# Patient Record
Sex: Female | Born: 1985
Health system: Southern US, Community
[De-identification: ages and names within clinical notes are randomized; demographics above are authoritative.]

## PROBLEM LIST (undated history)

## (undated) DIAGNOSIS — O1490 Unspecified pre-eclampsia, unspecified trimester: Secondary | ICD-10-CM

## (undated) DIAGNOSIS — F419 Anxiety disorder, unspecified: Secondary | ICD-10-CM

## (undated) HISTORY — PX: TONSILLECTOMY: SUR1361

## (undated) HISTORY — DX: Anxiety disorder, unspecified: F41.9

---

## 2014-08-13 ENCOUNTER — Encounter (HOSPITAL_BASED_OUTPATIENT_CLINIC_OR_DEPARTMENT_OTHER): Payer: Self-pay | Admitting: Emergency Medicine

## 2014-08-13 ENCOUNTER — Emergency Department (HOSPITAL_BASED_OUTPATIENT_CLINIC_OR_DEPARTMENT_OTHER)
Admission: EM | Admit: 2014-08-13 | Discharge: 2014-08-13 | Disposition: A | Discharge status: Home / Self Care | Attending: Emergency Medicine | Admitting: Emergency Medicine

## 2014-08-13 DIAGNOSIS — Z791 Long term (current) use of non-steroidal anti-inflammatories (NSAID): Secondary | ICD-10-CM | POA: Insufficient documentation

## 2014-08-13 DIAGNOSIS — M940 Chondrocostal junction syndrome [Tietze]: Secondary | ICD-10-CM | POA: Insufficient documentation

## 2014-08-13 DIAGNOSIS — R079 Chest pain, unspecified: Secondary | ICD-10-CM | POA: Insufficient documentation

## 2014-08-13 HISTORY — DX: Unspecified pre-eclampsia, unspecified trimester: O14.90

## 2014-08-13 MED ORDER — NAPROXEN 500 MG PO TABS: 500.0000 mg | ORAL_TABLET | Freq: Two times a day (BID) | ORAL | Status: DC

## 2014-08-13 NOTE — ED Provider Notes (Addendum)
CSN: [id number]     Arrival date & time 08/13/14  2055 History   First MD Initiated Contact with Patient 08/13/14 2150     Chief Complaint  Patient presents with  . Chest Pain      HPI  Bilateral anterior parasternal chest pain. States it is a sharp. It hurts when she leans over or rotates to the side. She leaned over at work today to pick something up and it started. He been present since that time. No falls injuries or trauma. No cough or shortness of breath. No fevers.  Past Medical History  Diagnosis Date  . Preeclampsia    Past Surgical History  Procedure Laterality Date  . Cesarean section    . Tonsillectomy     No family history on file. History  Substance Use Topics  . Smoking status: Never Smoker   . Smokeless tobacco: Not on file  . Alcohol Use: Yes   OB History   Grav Para Term Preterm Abortions TAB SAB Ect Mult Living                 Review of Systems  Constitutional: Negative for fever, chills, diaphoresis, appetite change and fatigue.  HENT: Negative for mouth sores, sore throat and trouble swallowing.   Eyes: Negative for visual disturbance.  Respiratory: Negative for cough, chest tightness, shortness of breath and wheezing.   Cardiovascular: Positive for chest pain.  Gastrointestinal: Negative for nausea, vomiting, abdominal pain, diarrhea and abdominal distention.  Endocrine: Negative for polydipsia, polyphagia and polyuria.  Genitourinary: Negative for dysuria, frequency and hematuria.  Musculoskeletal: Negative for gait problem.  Skin: Negative for color change, pallor and rash.  Neurological: Negative for dizziness, syncope, light-headedness and headaches.  Hematological: Does not bruise/bleed easily.  Psychiatric/Behavioral: Negative for behavioral problems and confusion.      Allergies  Review of patient's allergies indicates no known allergies.  Home Medications   Prior to Admission medications   Medication Sig Start Date End Date  Taking? Authorizing Provider  naproxen (NAPROSYN) 500 MG tablet Take 1 tablet (500 mg total) by mouth 2 (two) times daily. 08/13/14   Rolland Porter, MD   BP 128/88  Pulse 78  Temp(Src) 98.3 F (36.8 C) (Oral)  Resp 18  Ht  (1.702 m)  Wt 205 lb (92.987 kg)  BMI 32.10 kg/m2  SpO2 100%  LMP 08/12/2014 Physical Exam  Constitutional: She is oriented to person, place, and time. She appears well-developed and well-nourished. No distress.  HENT:  Head: Normocephalic.  Eyes: Conjunctivae are normal. Pupils are equal, round, and reactive to light. No scleral icterus.  Neck: Normal range of motion. Neck supple. No thyromegaly present.  Cardiovascular: Normal rate and regular rhythm.  Exam reveals no gallop and no friction rub.   No murmur heard. Pulmonary/Chest: Effort normal and breath sounds normal. No respiratory distress. She has no wheezes. She has no rales.    Abdominal: Soft. Bowel sounds are normal. She exhibits no distension. There is no tenderness. There is no rebound.  Musculoskeletal: Normal range of motion.  Neurological: She is alert and oriented to person, place, and time.  Skin: Skin is warm and dry. No rash noted.  Psychiatric: She has a normal mood and affect. Her behavior is normal.    ED Course  Procedures (including critical care time) Labs Review Labs Reviewed - No data to display  Imaging Review No results found.   EKG Interpretation None      MDM   Final  diagnoses:  Costochondritis    Reproducable tenderness and pain. Normal EKG.      Rolland Porter, MD 08/13/14 1610  Rolland Porter, MD 08/28/14 [phone]

## 2014-08-13 NOTE — ED Notes (Signed)
Provider at bedside for pt eval.

## 2014-08-13 NOTE — Discharge Instructions (Signed)
Costochondritis Costochondritis, sometimes called Tietze syndrome, is a swelling and irritation (inflammation) of the tissue (cartilage) that connects your ribs with your breastbone (sternum). It causes pain in the chest and rib area. Costochondritis usually goes away on its own over time. It can take up to 6 weeks or longer to get better, especially if you are unable to limit your activities. CAUSES  Some cases of costochondritis have no known cause. Possible causes include:  Injury (trauma).  Exercise or activity such as lifting.  Severe coughing. SIGNS AND SYMPTOMS  Pain and tenderness in the chest and rib area.  Pain that gets worse when coughing or taking deep breaths.  Pain that gets worse with specific movements. DIAGNOSIS  Your health care provider will do a physical exam and ask about your symptoms. Chest X-rays or other tests may be done to rule out other problems. TREATMENT  Costochondritis usually goes away on its own over time. Your health care provider may prescribe medicine to help relieve pain. HOME CARE INSTRUCTIONS   Avoid exhausting physical activity. Try not to strain your ribs during normal activity. This would include any activities using chest, abdominal, and side muscles, especially if heavy weights are used.  Apply ice to the affected area for the first 2 days after the pain begins.  Put ice in a plastic bag.  Place a towel between your skin and the bag.  Leave the ice on for 20 minutes, 2-3 times a day.  Only take over-the-counter or prescription medicines as directed by your health care provider. SEEK MEDICAL CARE IF:  You have redness or swelling at the rib joints. These are signs of infection.  Your pain does not go away despite rest or medicine. SEEK IMMEDIATE MEDICAL CARE IF:   Your pain increases or you are very uncomfortable.  You have shortness of breath or difficulty breathing.  You cough up blood.  You have worse chest pains,  sweating, or vomiting.  You have a fever or persistent symptoms for more than 2-3 days.  You have a fever and your symptoms suddenly get worse. MAKE SURE YOU:   Understand these instructions.  Will watch your condition.  Will get help right away if you are not doing well or get worse. Document Released: 09/17/2005 Document Revised: 09/28/2013 Document Reviewed: 07/12/2013 [facility] Patient Information 2015 [place], [place]. This information is not intended to replace advice given to you by your health care provider. Make sure you discuss any questions you have with your health care provider.  Costochondritis Costochondritis, sometimes called Tietze syndrome, is a swelling and irritation (inflammation) of the tissue (cartilage) that connects your ribs with your breastbone (sternum). It causes pain in the chest and rib area. Costochondritis usually goes away on its own over time. It can take up to 6 weeks or longer to get better, especially if you are unable to limit your activities. CAUSES  Some cases of costochondritis have no known cause. Possible causes include:  Injury (trauma).  Exercise or activity such as lifting.  Severe coughing. SIGNS AND SYMPTOMS  Pain and tenderness in the chest and rib area.  Pain that gets worse when coughing or taking deep breaths.  Pain that gets worse with specific movements. DIAGNOSIS  Your health care provider will do a physical exam and ask about your symptoms. Chest X-rays or other tests may be done to rule out other problems. TREATMENT  Costochondritis usually goes away on its own over time. Your health care provider may prescribe medicine  to help relieve pain. HOME CARE INSTRUCTIONS   Avoid exhausting physical activity. Try not to strain your ribs during normal activity. This would include any activities using chest, abdominal, and side muscles, especially if heavy weights are used.  Apply ice to the affected area for the first 2 days after  the pain begins.  Put ice in a plastic bag.  Place a towel between your skin and the bag.  Leave the ice on for 20 minutes, 2-3 times a day.  Only take over-the-counter or prescription medicines as directed by your health care provider. SEEK MEDICAL CARE IF:  You have redness or swelling at the rib joints. These are signs of infection.  Your pain does not go away despite rest or medicine. SEEK IMMEDIATE MEDICAL CARE IF:   Your pain increases or you are very uncomfortable.  You have shortness of breath or difficulty breathing.  You cough up blood.  You have worse chest pains, sweating, or vomiting.  You have a fever or persistent symptoms for more than 2-3 days.  You have a fever and your symptoms suddenly get worse. MAKE SURE YOU:   Understand these instructions.  Will watch your condition.  Will get help right away if you are not doing well or get worse. Document Released: 09/17/2005 Document Revised: 09/28/2013 Document Reviewed: 07/12/2013 [facility] Patient Information 2015 [place], [place]. This information is not intended to replace advice given to you by your health care provider. Make sure you discuss any questions you have with your health care provider.

## 2014-08-13 NOTE — ED Notes (Signed)
Pt reports centralized chest pressure described as heaviness - pt states she has attempted to drink tea and use warm compresses to alleviate the pain w/o success. Pt admits to family hx of cardiac disease - denies any recent cough/illness, fever, dizziness, shortness of breath or n/v.

## 2014-08-17 ENCOUNTER — Emergency Department (HOSPITAL_BASED_OUTPATIENT_CLINIC_OR_DEPARTMENT_OTHER)

## 2014-08-17 ENCOUNTER — Emergency Department (HOSPITAL_BASED_OUTPATIENT_CLINIC_OR_DEPARTMENT_OTHER)
Admission: EM | Admit: 2014-08-17 | Discharge: 2014-08-17 | Disposition: A | Discharge status: Home / Self Care | Attending: Emergency Medicine | Admitting: Emergency Medicine

## 2014-08-17 ENCOUNTER — Encounter (HOSPITAL_BASED_OUTPATIENT_CLINIC_OR_DEPARTMENT_OTHER): Payer: Self-pay | Admitting: Emergency Medicine

## 2014-08-17 DIAGNOSIS — S199XXA Unspecified injury of neck, initial encounter: Secondary | ICD-10-CM | POA: Diagnosis not present

## 2014-08-17 DIAGNOSIS — S0010XA Contusion of unspecified eyelid and periocular area, initial encounter: Secondary | ICD-10-CM | POA: Diagnosis not present

## 2014-08-17 DIAGNOSIS — S6990XA Unspecified injury of unspecified wrist, hand and finger(s), initial encounter: Secondary | ICD-10-CM

## 2014-08-17 DIAGNOSIS — S0993XA Unspecified injury of face, initial encounter: Secondary | ICD-10-CM | POA: Diagnosis not present

## 2014-08-17 DIAGNOSIS — S60219A Contusion of unspecified wrist, initial encounter: Secondary | ICD-10-CM | POA: Diagnosis not present

## 2014-08-17 DIAGNOSIS — S59909A Unspecified injury of unspecified elbow, initial encounter: Secondary | ICD-10-CM | POA: Diagnosis present

## 2014-08-17 DIAGNOSIS — T148XXA Other injury of unspecified body region, initial encounter: Secondary | ICD-10-CM

## 2014-08-17 DIAGNOSIS — IMO0002 Reserved for concepts with insufficient information to code with codable children: Secondary | ICD-10-CM | POA: Diagnosis not present

## 2014-08-17 DIAGNOSIS — Z791 Long term (current) use of non-steroidal anti-inflammatories (NSAID): Secondary | ICD-10-CM | POA: Insufficient documentation

## 2014-08-17 DIAGNOSIS — S59919A Unspecified injury of unspecified forearm, initial encounter: Secondary | ICD-10-CM

## 2014-08-17 DIAGNOSIS — M25531 Pain in right wrist: Secondary | ICD-10-CM
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
## 2014-08-17 NOTE — Discharge Instructions (Signed)
°Emergency Department Resource Guide °1) Find a Doctor and Pay Out of Pocket °Although you won't have to find out who is covered by your insurance plan, it is a good idea to ask around and get recommendations. You will then need to call the office and see if the doctor you have chosen will accept you as a new patient and what types of options they offer for patients who are self-pay. Some doctors offer discounts or will set up payment plans for their patients who do not have insurance, but you will need to ask so you aren't surprised when you get to your appointment. ° °2) Contact Your Local Health Department °Not all health departments have doctors that can see patients for sick visits, but many do, so it is worth a call to see if yours does. If you don't know where your local health department is, you can check in your phone book. The CDC also has a tool to help you locate your state's health department, and many state websites also have listings of all of their local health departments. ° °3) Find a Walk-in Clinic °If your illness is not likely to be very severe or complicated, you may want to try a walk in clinic. These are popping up all over the country in pharmacies, drugstores, and shopping centers. They're usually staffed by nurse practitioners or physician assistants that have been trained to treat common illnesses and complaints. They're usually fairly quick and inexpensive. However, if you have serious medical issues or chronic medical problems, these are probably not your best option. ° °No Primary Care Doctor: °- Call [facility] at  832-8000 - they can help you locate a primary care doctor that  accepts your insurance, provides certain services, etc. °- Physician Referral Service- 1-800-533-3463 ° °Chronic Pain Problems: °Organization         Address  Phone   Notes  °[facility]  (336) 297-2271 Patients need to be referred by their primary care doctor.  ° °Medication  Assistance: °Organization         Address  Phone   Notes  °[place] [facility] [address] °[place], [place] [address] (336) 641-8030 --Must be a resident of [place] °-- Must have NO insurance coverage whatsoever (no Medicaid/ Medicare, etc.) °-- The pt. MUST have a primary care doctor that directs their care regularly and follows them in the community °  °[facility]  (866) 331-1348   °[employer]  (888) 892-1162   ° °Agencies that provide inexpensive medical care: °Organization         Address  Phone   Notes  °[facility]  (336) 832-8035   °[facility]    (336) 832-7272   °[facility] [address] °[place], [place] [address] (336) 832-4777   °[facility] [address], °[place] (336) 271-4999   °[facility]    (336) 373-0678   °[facility]    (336) 272-1050   °[facility] ° [address],  Phone:  (336) 832-4444, Fax:  (336) 832-4440 Hours of Operation:  9 am - 6 pm, M-F.  Also accepts Medicaid/Medicare and self-pay.  °[facility] ° [address],  Phone: (336) 832-3150, Fax: (336) 832-3151. Hours of Operation:  8:30 am - 5:30 pm, M-F.  Also accepts Medicaid and self-pay.  °[facility] [place] [address], [place] Phone: (336) 878-6027   °[facility] [address], [place], [place] (336)723-1848, Ext. 123 Mondays & Thursdays: 7-9 AM.  First 15 patients are seen on a first come, first serve basis. °  ° °Medicaid-accepting [place] Providers: ° °Organization         Address  Phone   Notes  °[facility] [address], [place] (336) 641-2100 Also accepts self-pay patients.  °[facility] [address], [place] ° (336) 856-9996   °[facility] [address], [place]  (336) 288-8857   °[facility] [address] [place] [address], [place] (336) 299-7000   °Veita Bland [address], [place]  ° (336) 373-1557 Only accepts [place] Access Medicaid patients after they have their name applied to their card.  ° °Self-Pay (no insurance) in [place]: ° °Organization         Address  Phone   Notes  °Sickle Cell Patients, [facility] [address], [place] (336) 832-1970   °[facility] [address], [place] (336) 832-4400   ° [facility] ° [address], [place] (336) 992-4800   °[facility]/Dr. Osei-Bonsu ° [address] [place] [address], [place] or [address], [place] (336) 841-8500 Phone number for both [place] and [place] locations is the same.  °[facility] [address], [place] (336) 299-0000   °[facility] [address] [place] [address], [place] or [address] (336) 852-7530 °(336) 878-2260   °[facility] [address], [place] (336) 350-1642, phone; (336) 294-5005, fax Sees patients 1st and 3rd Saturday of every month.  Must not qualify for public or private insurance (i.e. Medicaid, Medicare, [place] Health Choice, Veterans' Benefits) • Household income should be no more than 200% of the poverty level •The clinic cannot treat you if you are pregnant or think you are pregnant • Sexually transmitted diseases are not treated at the clinic.  ° ° °Dental Care: °Organization         Address  Phone  Notes  °[place] [facility] [address], [place] (336) 641-6152 Accepts children up to age 21 who are enrolled in Medicaid or [place] Health Choice; pregnant women with a Medicaid card; and children who have applied for Medicaid or [place] Health Choice, but were declined, whose parents can pay a reduced fee at time of service.  °[place]  [facility] [place]  [address], [place] (336) 641-7733 Accepts children up to age 21 who are enrolled in Medicaid or [place] Health Choice; pregnant women with a Medicaid card; and children who have applied for Medicaid or [place] Health Choice, but were declined, whose parents can pay a reduced fee at time of service.  °[facility] ° [address],  (336) 641-4533 Patients are seen by appointment only. Walk-ins are not accepted. [facility] will see patients 18 years of age and older. °Monday - Tuesday (8am-5pm) °Most Wednesdays (8:30-5pm) °$30 per visit, cash only  °[facility] ° [address], [place] (336) 641-4533 Patients are seen by appointment only. Walk-ins are not accepted. [facility] will see patients 18 years of age and older. °One   Wednesday Evening (Monthly: Volunteer Based).  $30 per visit, cash only  °[facility]  (919) 537-3737 for adults; Children under age 4, call Graduate Pediatric Dentistry at (919) [phone]. Children aged 4-14, please call (919) 537-3737 to request a pediatric application. ° Dental services are provided in all areas of dental care including fillings, crowns and bridges, complete and partial dentures, implants, gum treatment, root canals, and extractions. Preventive care is also provided. Treatment is provided to both adults and children. °Patients are selected via a lottery and there is often a waiting list. °  °[facility] [address], °[place] ° (336) 763-8833 [email or web address] °  °[facility] [address], [place], [place] (336)723-1848, Ext. 123 Second and Fourth Thursday of each month, opens at 6:30 AM; Clinic ends at 9 AM.  Patients are seen on a first-come first-served basis, and a limited number are seen during each clinic.  ° °[facility] ° [address], [place], [place] (336) 723-7904    Eligibility Requirements °You must have lived in [place], [place], or [place] counties for at least the last three months. °  You cannot be eligible for state or federal sponsored healthcare insurance, including Veterans Administration, Medicaid, or Medicare. °  You generally cannot be eligible for healthcare insurance through your employer.  °  How to apply: °Eligibility screenings are held every Tuesday and Wednesday afternoon from 1:00 pm until 4:00 pm. You do not need an appointment for the interview!  °[facility] [address], [place], [place] 336-631-2330   °[facility]  336-342-8273   °[facility]  336-703-3100   °[facility]  336-570-6415   ° °Behavioral Health Resources in the Community: °Intensive Outpatient Programs °Organization         Address  Phone  Notes  °[facility] [address], [place], [place] 336-878-6098   °[facility] [address], [place], [place] 336-832-9800   °[facility] [address], [place], [place] ° 336-882-2125   °[facility] [address],  °[place], [place] 1-800-853-5163 or 336-641-4981   °Substance Abuse Resources °Organization         Address  Phone  Notes  °[facility]  336-882-2125   °[facility]  336-784-9470   °[facility]  336-285-9073   °[facility]  336-845-3988   °Residential & Outpatient Substance Abuse Program  1-800-659-3381   °Psychological Services °Organization         Address  Phone  Notes  °[facility]  336- 832-9600   °[facility]  336- 378-7881   °[facility] [address], [place] 1-800-853-5163 or 336-641-4981   ° °Mobile Crisis Teams °Organization         Address  Phone  Notes  °[facility], Mobile Crisis Care Unit  1-877-626-1772   °Assertive °[facility] ° [address]  [place], [place] 336-834-9664   °Sharon DeEsch [address] °[place] 336-554-5454   ° °Self-Help/Support Groups °Organization         Address  Phone             Notes  °[facility]  - variety of support groups  336- 373-1402 Call for more information  °Narcotics Anonymous (NA), [facility] [address], °[place]   2 meetings at this location  ° °  Residential Treatment Programs °Organization         Address  Phone  Notes  °[facility] [address],    °[place]  1-866-801-8205   °[facility] ° [address], [place], [place] 704-293-8524   °[facility] [address], [place] 336-845-3988 Admissions: 8am-3pm M-F  °[facility] [address]    °[place], [place] 336-841-1104   °[facility] [address], [place], [place] 336-379-7146   °[facility] [address]  °[place], [place] 336-285-9073   °[facility] Intensive Outpatient [address], [place], [place] 336-852-3033   °[facility] [address]  °[place], [place] 1-877-615-2722 or 336-784-9470   °[facility] [address] [place], [place] 336-227-7417 Accepts Medicaid  °[facility] [address]  °[place] 1-800-659-3381 Substance Abuse/Addiction Treatment  ° °[place] Behavioral Health Resources °Organization         Address  Phone  Notes  °[facility]  (888) 581-9988   °Julie Brannon, PhD [address] [place], [place]   (336) 349-5553 or (336) 951-0000   °[facility]   [address] °[place], [place] (336) 349-4454   °[facility] [address], [place], [place] (336) 342-8316 Insurance/Medicaid/sponsorship through [facility]  °[facility] [address]                                    [place], [place] (336) 342-8316 Therapy/tele-psych/case    °[facility] [address]  ° [place], [place] (336) 349-2233    °Dr. Arfeen  (336) 349-4544   °[facility] [place]  [employer] [place] [facility] 1) [address], [place] °2) [address], [place] °3)  [address], [place] (336) 349-3220 °(336) 342-7768 ° °(336) 342-8140   °[place] [facility] (336) 342-1394 or (336) 342-3537 (After Hours)    ° ° °

## 2014-08-22 ENCOUNTER — Ambulatory Visit (INDEPENDENT_AMBULATORY_CARE_PROVIDER_SITE_OTHER): Admitting: Emergency Medicine

## 2014-08-22 VITALS — BP 106/60 | HR 75 | Temp 98.0°F | Resp 16 | Ht 66.0 in | Wt 205.1 lb

## 2014-08-22 DIAGNOSIS — R350 Frequency of micturition: Secondary | ICD-10-CM

## 2014-08-22 DIAGNOSIS — N3000 Acute cystitis without hematuria: Secondary | ICD-10-CM

## 2014-08-22 LAB — POCT UA - MICROSCOPIC ONLY
CRYSTALS, UR, HPF, POC: NEGATIVE
Casts, Ur, LPF, POC: NEGATIVE
Mucus, UA: NEGATIVE
YEAST UA: NEGATIVE

## 2014-08-22 LAB — POCT URINALYSIS DIPSTICK
Bilirubin, UA: NEGATIVE
Glucose, UA: NEGATIVE
KETONES UA: NEGATIVE
NITRITE UA: POSITIVE
Protein, UA: 30
Spec Grav, UA: 1.01
Urobilinogen, UA: 0.2
pH, UA: 5.5

## 2014-08-22 MED ORDER — PHENAZOPYRIDINE HCL 200 MG PO TABS: 200.0000 mg | ORAL_TABLET | Freq: Three times a day (TID) | ORAL | Status: DC | PRN

## 2014-08-22 MED ORDER — SULFAMETHOXAZOLE-TMP DS 800-160 MG PO TABS: 1.0000 | ORAL_TABLET | Freq: Two times a day (BID) | ORAL | Status: DC

## 2014-08-22 NOTE — Progress Notes (Signed)
[facility] [address] [place] [address] [phone]  Date:  08/22/2014   Name:  Deborah Rodriguez   DOB:  1986-07-17   MRN:  [id number]  PCP:  No PCP Per Patient    Chief Complaint: Urinary Tract Infection   History of Present Illness:  Deborah Rodriguez is a 28 y.o. very pleasant female patient who presents with the following:  Patient gives a history of dysuria and urgency today.  No nausea or vomiting.  No back pain No stool change.  No vaginal discharge or bleeding.   No antecedent illness No improvement with over the counter medications or other home remedies.  Denies other complaint or health concern today.   There are no active problems to display for this patient.   Past Medical History  Diagnosis Date  . Preeclampsia   . Anxiety     Past Surgical History  Procedure Laterality Date  . Cesarean section    . Tonsillectomy      History  Substance Use Topics  . Smoking status: Never Smoker   . Smokeless tobacco: Never Used  . Alcohol Use: 0.5 oz/week    1 drink(s) per week    Family History  Problem Relation Age of Onset  . Diabetes Maternal Grandfather   . Stroke Paternal Grandfather     No Known Allergies  Medication list has been reviewed and updated.  No current outpatient prescriptions on file prior to visit.   No current facility-administered medications on file prior to visit.    Review of Systems:  As per HPI, otherwise negative.    Physical Examination: Filed Vitals:   08/22/14 2038  BP: 106/60  Pulse: 75  Temp: 98 F (36.7 C)  Resp: 16   Filed Vitals:   08/22/14 2038  Height:  (1.676 m)  Weight: 205 lb 2 oz (93.044 kg)   Body mass index is 33.12 kg/(m^2). Ideal Body Weight: Weight in (lb) to have BMI = 25: 154.6  GEN: WDWN, NAD, Non-toxic, A & O x 3 HEENT: Atraumatic, Normocephalic. Neck supple. No masses, No LAD. Ears and Nose: No external deformity. CV: RRR, No M/G/R. No JVD. No thrill. No  extra heart sounds. PULM: CTA B, no wheezes, crackles, rhonchi. No retractions. No resp. distress. No accessory muscle use. ABD: S, NT, ND, +BS. No rebound. No HSM. EXTR: No c/c/e NEURO Normal gait.  PSYCH: Normally interactive. Conversant. Not depressed or anxious appearing.  Calm demeanor.    Assessment and Plan: Cystitis Septra Pyridium  Signed,  Phillips Odor, MD  Results for orders placed in visit on 08/22/14  POCT UA - MICROSCOPIC ONLY      Result Value Ref Range   WBC, Ur, HPF, POC TNTC     RBC, urine, microscopic TNTC     Bacteria, U Microscopic 1+     Mucus, UA neg     Epithelial cells, urine per micros 3-5     Crystals, Ur, HPF, POC neg     Casts, Ur, LPF, POC neg     Yeast, UA neg    POCT URINALYSIS DIPSTICK      Result Value Ref Range   Color, UA yellow     Clarity, UA cloudy     Glucose, UA neg     Bilirubin, UA neg     Ketones, UA neg     Spec Grav, UA 1.010     Blood, UA large     pH, UA 5.5  Protein, UA 30     Urobilinogen, UA 0.2     Nitrite, UA positive     Leukocytes, UA moderate (2+)

## 2014-08-22 NOTE — Patient Instructions (Signed)

## 2014-08-26 ENCOUNTER — Encounter (HOSPITAL_BASED_OUTPATIENT_CLINIC_OR_DEPARTMENT_OTHER): Payer: Self-pay | Admitting: Emergency Medicine

## 2014-08-26 ENCOUNTER — Emergency Department (HOSPITAL_BASED_OUTPATIENT_CLINIC_OR_DEPARTMENT_OTHER)
Admission: EM | Admit: 2014-08-26 | Discharge: 2014-08-27 | Disposition: A | Discharge status: Home / Self Care | Attending: Emergency Medicine | Admitting: Emergency Medicine

## 2014-08-26 DIAGNOSIS — Z3202 Encounter for pregnancy test, result negative: Secondary | ICD-10-CM | POA: Insufficient documentation

## 2014-08-26 DIAGNOSIS — N390 Urinary tract infection, site not specified: Secondary | ICD-10-CM

## 2014-08-26 DIAGNOSIS — Z79899 Other long term (current) drug therapy: Secondary | ICD-10-CM | POA: Diagnosis not present

## 2014-08-26 DIAGNOSIS — F411 Generalized anxiety disorder: Secondary | ICD-10-CM | POA: Insufficient documentation

## 2014-08-26 LAB — URINE MICROSCOPIC-ADD ON

## 2014-08-26 LAB — URINALYSIS, ROUTINE W REFLEX MICROSCOPIC
Glucose, UA: NEGATIVE mg/dL
HGB URINE DIPSTICK: NEGATIVE
KETONES UR: NEGATIVE mg/dL
Nitrite: POSITIVE — AB
PH: 5.5 (ref 5.0–8.0)
Protein, ur: 30 mg/dL — AB
SPECIFIC GRAVITY, URINE: 1.018 (ref 1.005–1.030)
Urobilinogen, UA: 1 mg/dL (ref 0.0–1.0)

## 2014-08-26 LAB — PREGNANCY, URINE: Preg Test, Ur: NEGATIVE

## 2014-08-26 NOTE — ED Provider Notes (Signed)
CSN: [id number]     Arrival date & time 08/26/14  2303 History   First MD Initiated Contact with Patient 08/26/14 2356    This chart was scribed for Deborah Seamen, MD by Tonye Royalty, ED Scribe. This patient was seen in room MH02/MH02 and the patient's care was started at 11:59 PM.     Chief Complaint  Patient presents with  . Urinary Tract Infection   The history is provided by the patient and medical records. No language interpreter was used.   HPI Comments: Deborah Rodriguez is a 28 y.o. female who presents to the Emergency Department complaining of a UTI. She was seen at an urgent care for dysuria one week ago and was diagnosed with UTI and was prescribed Bactrim. She states she is still experiencing dysuria (burning with urination), which she rates at 8/10, and urinary frequency. She reports associated nausea but only once after taking Bactrim on an empty stomach. She denies fever, chills, vomiting, diarrhea, vaginal bleeding, vaginal discharge or hematuria.   Past Medical History  Diagnosis Date  . Preeclampsia   . Anxiety    Past Surgical History  Procedure Laterality Date  . Cesarean section    . Tonsillectomy     Family History  Problem Relation Age of Onset  . Diabetes Maternal Grandfather   . Stroke Paternal Grandfather    History  Substance Use Topics  . Smoking status: Never Smoker   . Smokeless tobacco: Never Used  . Alcohol Use: 0.5 oz/week    1 drink(s) per week   OB History   Grav Para Term Preterm Abortions TAB SAB Ect Mult Living                 Review of Systems A complete 10 system review of systems was obtained and all systems are negative except as noted in the HPI and PMH.   Allergies  Review of patient's allergies indicates no known allergies.  Home Medications   Prior to Admission medications   Medication Sig Start Date End Date Taking? Authorizing Provider  phenazopyridine (PYRIDIUM) 200 MG tablet Take 1 tablet (200 mg total) by mouth 3  (three) times daily as needed. 08/22/14  Yes Carmelina Dane, MD  sulfamethoxazole-trimethoprim (BACTRIM DS) 800-160 MG per tablet Take 1 tablet by mouth 2 (two) times daily. 08/22/14  Yes Carmelina Dane, MD  ciprofloxacin (CIPRO) 500 MG tablet Take 1 tablet (500 mg total) by mouth 2 (two) times daily. 08/27/14   Carlisle Beers Kenyatte Gruber, MD  fluconazole (DIFLUCAN) 150 MG tablet Take one as needed for vaginal yeast infection. May repeat in three days if needed. 08/27/14   Carlisle Beers Presten Joost, MD  phenazopyridine (PYRIDIUM) 200 MG tablet Take 1 tablet (200 mg total) by mouth 3 (three) times daily. 08/27/14   [place]olan [place] L Keyler Hoge, MD   BP 127/78  Pulse 87  Temp(Src) 98.6 F (37 C) (Oral)  Resp 22  SpO2 99%  LMP 08/14/2014 Physical Exam  Nursing note and vitals reviewed.  General: Well-developed, well-nourished female in no acute distress; appearance consistent with age of record HENT: normocephalic; atraumatic Eyes: pupils equal, round and reactive to light; extraocular muscles intact; left lateral subconjuctival hemorrhage Neck: supple Heart: regular rate and rhythm Lungs: clear to auscultation bilaterally Abdomen: soft; nondistended; nontender; no masses or hepatosplenomegaly; bowel sounds present Extremities: No deformity; full range of motion Neurologic: Awake, alert and oriented; motor function intact in all extremities and symmetric; no facial droop Skin: Warm and dry Psychiatric:  Normal mood and affect    ED Course  Procedures (including critical care time)  MDM   Nursing notes and vitals signs, including pulse oximetry, reviewed.  Summary of this visit's results, reviewed by myself:  Labs:  Results for orders placed during the hospital encounter of 08/26/14 (from the past 24 hour(s))  URINALYSIS, ROUTINE W REFLEX MICROSCOPIC     Status: Abnormal   Collection Time    08/26/14 11:24 PM      Result Value Ref Range   Color, Urine ORANGE (*) YELLOW   APPearance CLOUDY (*) CLEAR   Specific  Gravity, Urine 1.018  1.005 - 1.030   pH 5.5  5.0 - 8.0   Glucose, UA NEGATIVE  NEGATIVE mg/dL   Hgb urine dipstick NEGATIVE  NEGATIVE   Bilirubin Urine SMALL (*) NEGATIVE   Ketones, ur NEGATIVE  NEGATIVE mg/dL   Protein, ur 30 (*) NEGATIVE mg/dL   Urobilinogen, UA 1.0  0.0 - 1.0 mg/dL   Nitrite POSITIVE (*) NEGATIVE   Leukocytes, UA LARGE (*) NEGATIVE  PREGNANCY, URINE     Status: None   Collection Time    08/26/14 11:24 PM      Result Value Ref Range   Preg Test, Ur NEGATIVE  NEGATIVE  URINE MICROSCOPIC-ADD ON     Status: Abnormal   Collection Time    08/26/14 11:24 PM      Result Value Ref Range   Squamous Epithelial / LPF FEW (*) RARE   WBC, UA TOO NUMEROUS TO COUNT  <3 WBC/hpf   RBC / HPF 0-2  <3 RBC/hpf   Bacteria, UA MANY (*) RARE   Urine-Other MUCOUS PRESENT     History and labs consistent with a Bactrim-resistant cystitis.   Final diagnoses:  Acute lower urinary tract infection   I personally performed the services described in this documentation, which was scribed in my presence. The recorded information has been reviewed and is accurate.   Deborah Seamen, MD 08/27/14 [phone]

## 2014-08-26 NOTE — ED Notes (Signed)
Pt here for re evaluation of painful urination.  Pt was placed on abt for UTI on 9/1 and has not gotten any relief

## 2014-08-27 MED ORDER — PHENAZOPYRIDINE HCL 100 MG PO TABS
95.0000 mg | ORAL_TABLET | Freq: Once | ORAL | Status: AC
  Administered 2014-08-27: 100 mg via ORAL
  Filled 2014-08-27: qty 1

## 2014-08-27 MED ORDER — CIPROFLOXACIN HCL 500 MG PO TABS
500.0000 mg | ORAL_TABLET | Freq: Once | ORAL | Status: AC
  Administered 2014-08-27: 500 mg via ORAL
  Filled 2014-08-27: qty 1

## 2014-08-27 MED ORDER — PHENAZOPYRIDINE HCL 200 MG PO TABS: 200.0000 mg | ORAL_TABLET | Freq: Three times a day (TID) | ORAL | Status: DC

## 2014-08-27 MED ORDER — CIPROFLOXACIN HCL 500 MG PO TABS: 500.0000 mg | ORAL_TABLET | Freq: Two times a day (BID) | ORAL | Status: DC

## 2014-08-27 MED ORDER — FLUCONAZOLE 150 MG PO TABS: ORAL_TABLET | ORAL | Status: DC

## 2014-08-29 ENCOUNTER — Encounter (HOSPITAL_COMMUNITY): Payer: Self-pay | Admitting: Emergency Medicine

## 2014-08-29 DIAGNOSIS — R071 Chest pain on breathing: Secondary | ICD-10-CM | POA: Insufficient documentation

## 2014-08-29 DIAGNOSIS — R300 Dysuria: Secondary | ICD-10-CM | POA: Diagnosis not present

## 2014-08-29 DIAGNOSIS — Z3202 Encounter for pregnancy test, result negative: Secondary | ICD-10-CM | POA: Insufficient documentation

## 2014-08-29 DIAGNOSIS — Z792 Long term (current) use of antibiotics: Secondary | ICD-10-CM | POA: Insufficient documentation

## 2014-08-29 DIAGNOSIS — Z79899 Other long term (current) drug therapy: Secondary | ICD-10-CM | POA: Diagnosis not present

## 2014-08-29 DIAGNOSIS — R0602 Shortness of breath: Secondary | ICD-10-CM | POA: Diagnosis present

## 2014-08-29 DIAGNOSIS — Z8659 Personal history of other mental and behavioral disorders: Secondary | ICD-10-CM | POA: Diagnosis not present

## 2014-08-29 DIAGNOSIS — R109 Unspecified abdominal pain: Secondary | ICD-10-CM | POA: Insufficient documentation

## 2014-08-29 LAB — BASIC METABOLIC PANEL
Anion gap: 12 (ref 5–15)
BUN: 10 mg/dL (ref 6–23)
CHLORIDE: 97 meq/L (ref 96–112)
CO2: 26 meq/L (ref 19–32)
Calcium: 9.3 mg/dL (ref 8.4–10.5)
Creatinine, Ser: 0.84 mg/dL (ref 0.50–1.10)
GFR calc non Af Amer: >90 mL/min (ref >90)
Glucose, Bld: 86 mg/dL (ref 70–99)
POTASSIUM: 4 meq/L (ref 3.7–5.3)
Sodium: 135 mEq/L — ABNORMAL LOW (ref 137–147)

## 2014-08-29 LAB — I-STAT TROPONIN, ED: Troponin i, poc: 0 ng/mL (ref 0.00–0.08)

## 2014-08-29 LAB — CBC
HCT: 33.7 % — ABNORMAL LOW (ref 36.0–46.0)
Hemoglobin: 10.9 g/dL — ABNORMAL LOW (ref 12.0–15.0)
MCH: 24.7 pg — ABNORMAL LOW (ref 26.0–34.0)
MCHC: 32.3 g/dL (ref 30.0–36.0)
MCV: 76.2 fL — ABNORMAL LOW (ref 78.0–100.0)
Platelets: 267 10*3/uL (ref 150–400)
RBC: 4.42 MIL/uL (ref 3.87–5.11)
RDW: 13.5 % (ref 11.5–15.5)
WBC: 6.6 10*3/uL (ref 4.0–10.5)

## 2014-08-29 LAB — PRO B NATRIURETIC PEPTIDE: Pro B Natriuretic peptide (BNP): <5 pg/mL (ref 0–125)

## 2014-08-29 LAB — URINE CULTURE: Colony Count: >=100000

## 2014-08-29 NOTE — ED Notes (Signed)
The patient said she has been having chest "heaviness" and SOB since last night about 0400hrs.  She did not come last night because she has two small kids and had to wait for her husband to get home.  The patient denies any other symptoms other than SOB, back pain and heaviness.

## 2014-08-30 ENCOUNTER — Emergency Department (HOSPITAL_COMMUNITY)
Admission: EM | Admit: 2014-08-30 | Discharge: 2014-08-30 | Disposition: A | Discharge status: Home / Self Care | Attending: Emergency Medicine | Admitting: Emergency Medicine

## 2014-08-30 ENCOUNTER — Emergency Department (HOSPITAL_COMMUNITY)

## 2014-08-30 DIAGNOSIS — R300 Dysuria: Secondary | ICD-10-CM

## 2014-08-30 DIAGNOSIS — R079 Chest pain, unspecified: Secondary | ICD-10-CM

## 2014-08-30 LAB — URINALYSIS, ROUTINE W REFLEX MICROSCOPIC
BILIRUBIN URINE: NEGATIVE
Glucose, UA: NEGATIVE mg/dL
Hgb urine dipstick: NEGATIVE
Ketones, ur: NEGATIVE mg/dL
Leukocytes, UA: NEGATIVE
Nitrite: NEGATIVE
Protein, ur: NEGATIVE mg/dL
SPECIFIC GRAVITY, URINE: 1.016 (ref 1.005–1.030)
Urobilinogen, UA: 0.2 mg/dL (ref 0.0–1.0)
pH: 5.5 (ref 5.0–8.0)

## 2014-08-30 LAB — WET PREP, GENITAL
Clue Cells Wet Prep HPF POC: NONE SEEN
Trich, Wet Prep: NONE SEEN
Yeast Wet Prep HPF POC: NONE SEEN

## 2014-08-30 LAB — POC URINE PREG, ED: PREG TEST UR: NEGATIVE

## 2014-08-30 MED ORDER — IBUPROFEN 800 MG PO TABS
800.0000 mg | ORAL_TABLET | Freq: Once | ORAL | Status: AC
  Administered 2014-08-30: 800 mg via ORAL
  Filled 2014-08-30: qty 1

## 2014-08-30 NOTE — ED Provider Notes (Signed)
CSN: [id number]     Arrival date & time 08/29/14  2127 History   First MD Initiated Contact with Patient 08/30/14 0043     Chief Complaint  Patient presents with  . Chest Pain    The patient said she has been having chest "heaviness" and SOB since last night about 0400hrs.  She did not come last night because she has two small kids and had to wait for her husband to get home.  Marland Kitchen Shortness of Breath     (Consider location/radiation/quality/duration/timing/severity/associated sxs/prior Treatment) HPI  Deborah Rodriguez is a 28 y.o. female with a past medical history of anxiety coming in with chest pain and burning with urination. Patient states she's been in the emergency department 4 times the past couple of weeks for the same complaints. She describes the chest pain right-sided with some radiation to her right neck. It is worse with laying down and feels like a heavy sensation. There is some associated shortness of breath. She denies any exertional component emesis or diaphoresis. She denies history or risk factors for blood clots. Patient states he also has dysuria. She was diagnosed the UTI, given Bactrim. Her UTI was resistant to this and she is now on a course of Cipro. She states the frequency has improved but the burning persists. Denying any fevers chills or recent infections. She has had unprotected sex with her husband. She denies vaginal discharge or abnormal vaginal bleeding.  10 Systems reviewed and are negative for acute change except as noted in the HPI.    Past Medical History  Diagnosis Date  . Preeclampsia   . Anxiety    Past Surgical History  Procedure Laterality Date  . Cesarean section    . Tonsillectomy     Family History  Problem Relation Age of Onset  . Diabetes Maternal Grandfather   . Stroke Paternal Grandfather    History  Substance Use Topics  . Smoking status: Never Smoker   . Smokeless tobacco: Never Used  . Alcohol Use: 0.5 oz/week    1 drink(s) per  week   OB History   Grav Para Term Preterm Abortions TAB SAB Ect Mult Living                 Review of Systems    Allergies  Review of patient's allergies indicates no known allergies.  Home Medications   Prior to Admission medications   Medication Sig Start Date End Date Taking? Authorizing Provider  ciprofloxacin (CIPRO) 500 MG tablet Take 1 tablet (500 mg total) by mouth 2 (two) times daily. 08/27/14  Yes John L Molpus, MD  phenazopyridine (PYRIDIUM) 200 MG tablet Take 200 mg by mouth 3 (three) times daily as needed for pain.   Yes Historical Provider, MD  fluconazole (DIFLUCAN) 150 MG tablet Take one as needed for vaginal yeast infection. May repeat in three days if needed. 08/27/14   John L Molpus, MD   BP 144/85  Pulse 81  Temp(Src) 97.9 F (36.6 C) (Oral)  Resp 16  SpO2 100%  LMP 08/14/2014 Physical Exam  Nursing note and vitals reviewed. Constitutional: She is oriented to person, place, and time. She appears well-developed and well-nourished. No distress.  HENT:  Head: Normocephalic and atraumatic.  Nose: Nose normal.  Mouth/Throat: Oropharynx is clear and moist. No oropharyngeal exudate.  Eyes: Conjunctivae and EOM are normal. Pupils are equal, round, and reactive to light. No scleral icterus.  Neck: Normal range of motion. Neck supple. No JVD present. No  tracheal deviation present. No thyromegaly present.  Cardiovascular: Normal rate, regular rhythm and normal heart sounds.  Exam reveals no gallop and no friction rub.   No murmur heard. Pulmonary/Chest: Effort normal and breath sounds normal. No respiratory distress. She has no wheezes. She exhibits tenderness.  Tenderness to palpation over the right anterior chest wall.  Abdominal: Soft. Bowel sounds are normal. She exhibits no distension and no mass. There is tenderness. There is no rebound and no guarding.  Mild suprapubic tenderness to palpation.  Musculoskeletal: Normal range of motion. She exhibits no edema and  no tenderness.  Lymphadenopathy:    She has no cervical adenopathy.  Neurological: She is alert and oriented to person, place, and time. No cranial nerve deficit. She exhibits normal muscle tone.  Skin: Skin is warm and dry. No rash noted. No erythema. No pallor.    ED Course  Procedures (including critical care time) Labs Review Labs Reviewed  WET PREP, GENITAL - Abnormal; Notable for the following:    WBC, Wet Prep HPF POC FEW (*)    All other components within normal limits  CBC - Abnormal; Notable for the following:    Hemoglobin 10.9 (*)    HCT 33.7 (*)    MCV 76.2 (*)    MCH 24.7 (*)    All other components within normal limits  BASIC METABOLIC PANEL - Abnormal; Notable for the following:    Sodium 135 (*)    All other components within normal limits  URINE CULTURE  GC/CHLAMYDIA PROBE AMP  PRO B NATRIURETIC PEPTIDE  URINALYSIS, ROUTINE W REFLEX MICROSCOPIC  I-STAT TROPOININ, ED  POC URINE PREG, ED    Imaging Review Dg Chest 2 View  08/30/2014   CLINICAL DATA:  Chest pain and shortness of breath since last night.  EXAM: CHEST  2 VIEW  COMPARISON:  None.  FINDINGS: The heart size and mediastinal contours are within normal limits. Both lungs are clear. The visualized skeletal structures are unremarkable.  IMPRESSION: No active cardiopulmonary disease.   Electronically Signed   By: Burman Nieves M.D.   On: 08/30/2014 01:30     EKG Interpretation   Date/Time:  Tuesday August 29 2014 21:34:10 EDT Ventricular Rate:  87 PR Interval:  182 QRS Duration: 88 QT Interval:  384 QTC Calculation: 462 R Axis:   85 Text Interpretation:  Normal sinus rhythm Normal ECG No significant change  since last tracing Confirmed by Erroll Luna [phone]) on 08/30/2014  1:50:54 AM      MDM   Final diagnoses:  None    History presents emergency department out of concern for chest pain and dysuria. Low concern that her chest pain is from an emergent etiology.  It is not  exertional with no associated diaphoresis or emesis. She is PERC negative.  Was given a Motrin in the emergency department. We'll repeat urinalysis with a culture and perform pelvic exam.  Patient found resting comfortably in the room in no acute distress. Urinalysis is normal with no signs of infection. Patient advised to continue Cipro until completion. Motrin relieved the patient's chest pain. Patient will be discharged with primary care followup within next 3 days. Currently awaiting wet prep prior to discharge. Her vital signs remained stable and she is safe for discharge.  Tomasita Crumble, MD 08/30/14 [phone]

## 2014-08-30 NOTE — Discharge Instructions (Signed)
Chest Pain (Nonspecific) It is often hard to give a diagnosis for the cause of chest pain. There is always a chance that your pain could be related to something serious, such as a heart attack or a blood clot in the lungs. You need to follow up with your doctor. HOME CARE  If antibiotic medicine was given, take it as directed by your doctor. Finish the medicine even if you start to feel better.  For the next few days, avoid activities that bring on chest pain. Continue physical activities as told by your doctor.  Do not use any tobacco products. This includes cigarettes, chewing tobacco, and e-cigarettes.  Avoid drinking alcohol.  Only take medicine as told by your doctor.  Follow your doctor's suggestions for more testing if your chest pain does not go away.  Keep all doctor visits you made. GET HELP IF:  Your chest pain does not go away, even after treatment.  You have a rash with blisters on your chest.  You have a fever. GET HELP RIGHT AWAY IF:   You have more pain or pain that spreads to your arm, neck, jaw, back, or belly (abdomen).  You have shortness of breath.  You cough more than usual or cough up blood.  You have very bad back or belly pain.  You feel sick to your stomach (nauseous) or throw up (vomit).  You have very bad weakness.  You pass out (faint).  You have chills. This is an emergency. Do not wait to see if the problems will go away. Call your local emergency services (911 in [place] Do not drive yourself to the hospital. MAKE SURE YOU:   Understand these instructions.  Will watch your condition.  Will get help right away if you are not doing well or get worse. Document Released: 05/26/2008 Document Revised: 12/13/2013 Document Reviewed: 05/26/2008 [facility] Patient Information 2015 [place], [place]. This information is not intended to replace advice given to you by your health care provider. Make sure you discuss any questions you have with your  health care provider. Dysuria Dysuria is the medical term for pain with urination. There are many causes for dysuria, but urinary tract infection is the most common. If a urinalysis was performed it can show that there is a urinary tract infection. A urine culture confirms that you or your child is sick. You will need to follow up with a healthcare provider because:  If a urine culture was done you will need to know the culture results and treatment recommendations.  If the urine culture was positive, you or your child will need to be put on antibiotics or know if the antibiotics prescribed are the right antibiotics for your urinary tract infection.  If the urine culture is negative (no urinary tract infection), then other causes may need to be explored or antibiotics need to be stopped. Today laboratory work may have been done and there does not seem to be an infection. If cultures were done they will take at least 24 to 48 hours to be completed. Today x-rays may have been taken and they read as normal. No cause can be found for the problems. The x-rays may be re-read by a radiologist and you will be contacted if additional findings are made. You or your child may have been put on medications to help with this problem until you can see your primary caregiver. If the problems get better, see your primary caregiver if the problems return. If you were given  antibiotics (medications which kill germs), take all of the mediations as directed for the full course of treatment.  If laboratory work was done, you need to find the results. Leave a telephone number where you can be reached. If this is not possible, make sure you find out how you are to get test results. HOME CARE INSTRUCTIONS   Drink lots of fluids. For adults, drink eight, 8 ounce glasses of clear juice or water a day. For children, replace fluids as suggested by your caregiver.  Empty the bladder often. Avoid holding urine for long periods of  time.  After a bowel movement, women should cleanse front to back, using each tissue only once.  Empty your bladder before and after sexual intercourse.  Take all the medicine given to you until it is gone. You may feel better in a few days, but TAKE ALL MEDICINE.  Avoid caffeine, tea, alcohol and carbonated beverages, because they tend to irritate the bladder.  In men, alcohol may irritate the prostate.  Only take over-the-counter or prescription medicines for pain, discomfort, or fever as directed by your caregiver.  If your caregiver has given you a follow-up appointment, it is very important to keep that appointment. Not keeping the appointment could result in a chronic or permanent injury, pain, and disability. If there is any problem keeping the appointment, you must call back to this facility for assistance. SEEK IMMEDIATE MEDICAL CARE IF:   Back pain develops.  A fever develops.  There is nausea (feeling sick to your stomach) or vomiting (throwing up).  Problems are no better with medications or are getting worse. MAKE SURE YOU:   Understand these instructions.  Will watch your condition.  Will get help right away if you are not doing well or get worse. Document Released: 09/05/2004 Document Revised: 03/01/2012 Document Reviewed: 07/13/2008 [facility] Patient Information 2015 [place], [place]. This information is not intended to replace advice given to you by your health care provider. Make sure you discuss any questions you have with your health care provider.

## 2014-08-31 LAB — URINE CULTURE
COLONY COUNT: NO GROWTH
Culture: NO GROWTH
SPECIAL REQUESTS: NORMAL

## 2014-08-31 LAB — GC/CHLAMYDIA PROBE AMP
CT Probe RNA: NEGATIVE
GC Probe RNA: NEGATIVE

## 2014-08-31 NOTE — ED Notes (Signed)
Urine culture (+), currently treated with Ciprofloxacin, OK per Renold Don, NP

## 2015-02-20 ENCOUNTER — Emergency Department (HOSPITAL_BASED_OUTPATIENT_CLINIC_OR_DEPARTMENT_OTHER)
Admission: EM | Admit: 2015-02-20 | Discharge: 2015-02-20 | Disposition: A | Discharge status: Home / Self Care | Attending: Emergency Medicine | Admitting: Emergency Medicine

## 2015-02-20 ENCOUNTER — Encounter (HOSPITAL_BASED_OUTPATIENT_CLINIC_OR_DEPARTMENT_OTHER): Payer: Self-pay | Admitting: *Deleted

## 2015-02-20 DIAGNOSIS — Z8659 Personal history of other mental and behavioral disorders: Secondary | ICD-10-CM | POA: Insufficient documentation

## 2015-02-20 DIAGNOSIS — Z792 Long term (current) use of antibiotics: Secondary | ICD-10-CM | POA: Insufficient documentation

## 2015-02-20 DIAGNOSIS — R51 Headache: Secondary | ICD-10-CM | POA: Insufficient documentation

## 2015-02-20 DIAGNOSIS — J069 Acute upper respiratory infection, unspecified: Secondary | ICD-10-CM | POA: Diagnosis not present

## 2015-02-20 DIAGNOSIS — R0981 Nasal congestion: Secondary | ICD-10-CM | POA: Diagnosis present

## 2015-02-20 DIAGNOSIS — Z3202 Encounter for pregnancy test, result negative: Secondary | ICD-10-CM | POA: Insufficient documentation

## 2015-02-20 DIAGNOSIS — R519 Headache, unspecified: Secondary | ICD-10-CM

## 2015-02-20 LAB — URINALYSIS, ROUTINE W REFLEX MICROSCOPIC
Bilirubin Urine: NEGATIVE
Glucose, UA: NEGATIVE mg/dL
Hgb urine dipstick: NEGATIVE
Ketones, ur: NEGATIVE mg/dL
Leukocytes, UA: NEGATIVE
Nitrite: NEGATIVE
Protein, ur: NEGATIVE mg/dL
Specific Gravity, Urine: 1.028 (ref 1.005–1.030)
Urobilinogen, UA: 1 mg/dL (ref 0.0–1.0)
pH: 6 (ref 5.0–8.0)

## 2015-02-20 LAB — PREGNANCY, URINE: Preg Test, Ur: NEGATIVE

## 2015-02-20 MED ORDER — FLUTICASONE PROPIONATE 50 MCG/ACT NA SUSP: 2.0000 | Freq: Every day | NASAL | Status: DC

## 2015-02-20 MED ORDER — PSEUDOEPHEDRINE HCL 30 MG PO TABS: 30.0000 mg | ORAL_TABLET | ORAL | Status: DC | PRN

## 2015-02-20 MED ORDER — KETOROLAC TROMETHAMINE 30 MG/ML IJ SOLN
30.0000 mg | Freq: Once | INTRAMUSCULAR | Status: AC
  Administered 2015-02-20: 30 mg via INTRAMUSCULAR
  Filled 2015-02-20: qty 1

## 2015-02-20 NOTE — ED Notes (Signed)
Chest congestion, headache, and aching all over.

## 2015-02-20 NOTE — ED Provider Notes (Signed)
CSN: [id number]     Arrival date & time 02/20/15  1913 History   First MD Initiated Contact with Patient 02/20/15 2018     Chief Complaint  Patient presents with  . URI     (Consider location/radiation/quality/duration/timing/severity/associated sxs/prior Treatment) HPI Deborah Rodriguez is a 29 y.o. female With history of anxiety, presents to emergency department complaining of headache, nasal congestion, sinus pressure, postnasal drainage, weakness. Patient states symptoms began 4 days ago. States she has been feeling very tired and has stayed in bed all weekend. [facility], no other medications taken since then. States she has had some nausea, denies vomiting. States she had an episode of diarrhea a day before her symptoms began. She denies any fever or chills. She denies any urinary symptoms, no vaginal complaints. No abdominal pain. States "I just want to make sure the flu." states nothing is making her symptoms better or worse. Denies neck pain or stiffness. No recent ill contacts.  Past Medical History  Diagnosis Date  . Preeclampsia   . Anxiety    Past Surgical History  Procedure Laterality Date  . Cesarean section    . Tonsillectomy     Family History  Problem Relation Age of Onset  . Diabetes Maternal Grandfather   . Stroke Paternal Grandfather    History  Substance Use Topics  . Smoking status: Never Smoker   . Smokeless tobacco: Never Used  . Alcohol Use: 0.5 oz/week    1 drink(s) per week   OB History    No data available     Review of Systems  Constitutional: Negative for fever and chills.  HENT: Positive for congestion and sinus pressure. Negative for ear pain and sore throat.   Respiratory: Negative for cough, chest tightness and shortness of breath.   Cardiovascular: Negative for chest pain, palpitations and leg swelling.  Gastrointestinal: Negative for vomiting, abdominal pain and diarrhea.  Musculoskeletal: Positive for myalgias. Negative  for neck pain and neck stiffness.  Skin: Negative for rash.  Neurological: Positive for headaches. Negative for dizziness, weakness, light-headedness and numbness.  All other systems reviewed and are negative.     Allergies  Review of patient's allergies indicates no known allergies.  Home Medications   Prior to Admission medications   Medication Sig Start Date End Date Taking? Authorizing Provider  ciprofloxacin (CIPRO) 500 MG tablet Take 1 tablet (500 mg total) by mouth 2 (two) times daily. 08/27/14   Carlisle BeersJohn L Molpus, MD  fluconazole (DIFLUCAN) 150 MG tablet Take one as needed for vaginal yeast infection. May repeat in three days if needed. 08/27/14   Carlisle BeersJohn L Molpus, MD  phenazopyridine (PYRIDIUM) 200 MG tablet Take 200 mg by mouth 3 (three) times daily as needed for pain.    Historical Provider, MD   BP 125/72 mmHg  Pulse 71  Temp(Src) 98.8 F (37.1 C) (Oral)  Resp 16  Ht 5\' 7"  (1.702 m)  Wt 205 lb (92.987 kg)  BMI 32.10 kg/m2  SpO2 100%  LMP 02/15/2015 Physical Exam  Constitutional: She is oriented to person, place, and time. She appears well-developed and well-nourished. No distress.  HENT:  Head: Normocephalic and atraumatic.  Right Ear: Tympanic membrane, external ear and ear canal normal.  Left Ear: Tympanic membrane, external ear and ear canal normal.  Nose: Mucosal edema and rhinorrhea present. Right sinus exhibits no maxillary sinus tenderness and no frontal sinus tenderness. Left sinus exhibits no maxillary sinus tenderness and no frontal sinus tenderness.  Mouth/Throat:  Uvula is midline, oropharynx is clear and moist and mucous membranes are normal.  Eyes: Conjunctivae are normal.  Neck: Normal range of motion. Neck supple.  No meningismus  Cardiovascular: Normal rate, regular rhythm and normal heart sounds.   Pulmonary/Chest: Effort normal and breath sounds normal. No respiratory distress. She has no wheezes. She has no rales.  Abdominal: Soft. There is no tenderness.   Musculoskeletal: She exhibits no edema.  Neurological: She is alert and oriented to person, place, and time. No cranial nerve deficit. Coordination normal.  Skin: Skin is warm and dry.  Psychiatric: She has a normal mood and affect. Her behavior is normal.  Nursing note and vitals reviewed.   ED Course  Procedures (including critical care time) Labs Review Labs Reviewed  URINALYSIS, ROUTINE W REFLEX MICROSCOPIC  PREGNANCY, URINE    Imaging Review No results found.   EKG Interpretation None      MDM   Final diagnoses:  URI (upper respiratory infection)  Nonintractable headache, unspecified chronicity pattern, unspecified headache type    Patient with URI symptoms, weakness, body aches. She is afebrile, nontoxic appearing. No meningismus. Exam is completely unremarkable other than some postnasal drainage and nasal mucosal edema. UA checked, urine pregnancy negative, and no signs of infection. Most likely viral URI.  Plan to discharge home with Motrin and Tylenol, Sudafed, Flonase. Follow up with primary care doctor.  Filed Vitals:   02/20/15 1924 02/20/15 2102  BP: 120/78 125/72  Pulse: 84 71  Temp: 98.8 F (37.1 C)   TempSrc: Oral   Resp: 18 16  Height:  (1.702 m)   Weight: 205 lb (92.987 kg)   SpO2: 100% 100%       Lottie Mussel, PA-C 02/20/15 2210  Rolan Bucco, MD 02/20/15 2218

## 2015-02-20 NOTE — Discharge Instructions (Signed)
Tylenol or motrin for headache. Make sure to drink plenty of fluids. Sudafed and flonase for sinus congestion. Follow up with your doctor if symptoms do not improve, you may need more tests if this continues.   Upper Respiratory Infection, Adult An upper respiratory infection (URI) is also sometimes known as the common cold. The upper respiratory tract includes the nose, sinuses, throat, trachea, and bronchi. Bronchi are the airways leading to the lungs. Most people improve within 1 week, but symptoms can last up to 2 weeks. A residual cough may last even longer.  CAUSES Many different viruses can infect the tissues lining the upper respiratory tract. The tissues become irritated and inflamed and often become very moist. Mucus production is also common. A cold is contagious. You can easily spread the virus to others by oral contact. This includes kissing, sharing a glass, coughing, or sneezing. Touching your mouth or nose and then touching a surface, which is then touched by another person, can also spread the virus. SYMPTOMS  Symptoms typically develop 1 to 3 days after you come in contact with a cold virus. Symptoms vary from person to person. They may include:  Runny nose.  Sneezing.  Nasal congestion.  Sinus irritation.  Sore throat.  Loss of voice (laryngitis).  Cough.  Fatigue.  Muscle aches.  Loss of appetite.  Headache.  Low-grade fever. DIAGNOSIS  You might diagnose your own cold based on familiar symptoms, since most people get a cold 2 to 3 times a year. Your caregiver can confirm this based on your exam. Most importantly, your caregiver can check that your symptoms are not due to another disease such as strep throat, sinusitis, pneumonia, asthma, or epiglottitis. Blood tests, throat tests, and X-rays are not necessary to diagnose a common cold, but they may sometimes be helpful in excluding other more serious diseases. Your caregiver will decide if any further tests are  required. RISKS AND COMPLICATIONS  You may be at risk for a more severe case of the common cold if you smoke cigarettes, have chronic heart disease (such as heart failure) or lung disease (such as asthma), or if you have a weakened immune system. The very young and very old are also at risk for more serious infections. Bacterial sinusitis, middle ear infections, and bacterial pneumonia can complicate the common cold. The common cold can worsen asthma and chronic obstructive pulmonary disease (COPD). Sometimes, these complications can require emergency medical care and may be life-threatening. PREVENTION  The best way to protect against getting a cold is to practice good hygiene. Avoid oral or hand contact with people with cold symptoms. Wash your hands often if contact occurs. There is no clear evidence that vitamin C, vitamin E, echinacea, or exercise reduces the chance of developing a cold. However, it is always recommended to get plenty of rest and practice good nutrition. TREATMENT  Treatment is directed at relieving symptoms. There is no cure. Antibiotics are not effective, because the infection is caused by a virus, not by bacteria. Treatment may include:  Increased fluid intake. Sports drinks offer valuable electrolytes, sugars, and fluids.  Breathing heated mist or steam (vaporizer or shower).  Eating chicken soup or other clear broths, and maintaining good nutrition.  Getting plenty of rest.  Using gargles or lozenges for comfort.  Controlling fevers with ibuprofen or acetaminophen as directed by your caregiver.  Increasing usage of your inhaler if you have asthma. Zinc gel and zinc lozenges, taken in the first 24 hours of the  common cold, can shorten the duration and lessen the severity of symptoms. Pain medicines may help with fever, muscle aches, and throat pain. A variety of non-prescription medicines are available to treat congestion and runny nose. Your caregiver can make  recommendations and may suggest nasal or lung inhalers for other symptoms.  HOME CARE INSTRUCTIONS   Only take over-the-counter or prescription medicines for pain, discomfort, or fever as directed by your caregiver.  Use a warm mist humidifier or inhale steam from a shower to increase air moisture. This may keep secretions moist and make it easier to breathe.  Drink enough water and fluids to keep your urine clear or pale yellow.  Rest as needed.  Return to work when your temperature has returned to normal or as your caregiver advises. You may need to stay home longer to avoid infecting others. You can also use a face mask and careful hand washing to prevent spread of the virus. SEEK MEDICAL CARE IF:   After the first few days, you feel you are getting worse rather than better.  You need your caregiver's advice about medicines to control symptoms.  You develop chills, worsening shortness of breath, or brown or red sputum. These may be signs of pneumonia.  You develop yellow or brown nasal discharge or pain in the face, especially when you bend forward. These may be signs of sinusitis.  You develop a fever, swollen neck glands, pain with swallowing, or white areas in the back of your throat. These may be signs of strep throat. SEEK IMMEDIATE MEDICAL CARE IF:   You have a fever.  You develop severe or persistent headache, ear pain, sinus pain, or chest pain.  You develop wheezing, a prolonged cough, cough up blood, or have a change in your usual mucus (if you have chronic lung disease).  You develop sore muscles or a stiff neck. Document Released: 06/03/2001 Document Revised: 03/01/2012 Document Reviewed: 03/15/2014 [facility] Patient Information 2015 [place], [place]. This information is not intended to replace advice given to you by your health care provider. Make sure you discuss any questions you have with your health care provider.

## 2015-02-20 NOTE — ED Notes (Addendum)
C/o ha, congestion,  onset Friday pm, rates pain 10/10  Took bc Friday night  Nothing since,  Onset of back pain and abd cramping x 2 days  states 4/10 pain  Denies burning w urination, no vag dc

## 2015-04-16 ENCOUNTER — Emergency Department (HOSPITAL_BASED_OUTPATIENT_CLINIC_OR_DEPARTMENT_OTHER)
Admission: EM | Admit: 2015-04-16 | Discharge: 2015-04-16 | Disposition: A | Discharge status: Home / Self Care | Attending: Emergency Medicine | Admitting: Emergency Medicine

## 2015-04-16 ENCOUNTER — Encounter (HOSPITAL_BASED_OUTPATIENT_CLINIC_OR_DEPARTMENT_OTHER): Payer: Self-pay | Admitting: *Deleted

## 2015-04-16 DIAGNOSIS — Z79899 Other long term (current) drug therapy: Secondary | ICD-10-CM | POA: Diagnosis not present

## 2015-04-16 DIAGNOSIS — J040 Acute laryngitis: Secondary | ICD-10-CM | POA: Insufficient documentation

## 2015-04-16 DIAGNOSIS — Z792 Long term (current) use of antibiotics: Secondary | ICD-10-CM | POA: Insufficient documentation

## 2015-04-16 DIAGNOSIS — R0981 Nasal congestion: Secondary | ICD-10-CM | POA: Insufficient documentation

## 2015-04-16 DIAGNOSIS — Z7951 Long term (current) use of inhaled steroids: Secondary | ICD-10-CM | POA: Insufficient documentation

## 2015-04-16 DIAGNOSIS — Z8659 Personal history of other mental and behavioral disorders: Secondary | ICD-10-CM | POA: Diagnosis not present

## 2015-04-16 LAB — RAPID STREP SCREEN (MED CTR MEBANE ONLY): Streptococcus, Group A Screen (Direct): NEGATIVE

## 2015-04-16 MED ORDER — GUAIFENESIN-DM 100-10 MG/5ML PO SYRP: 10.0000 mL | ORAL_SOLUTION | Freq: Four times a day (QID) | ORAL | Status: DC | PRN

## 2015-04-16 MED ORDER — BENZONATATE 100 MG PO CAPS: 100.0000 mg | ORAL_CAPSULE | Freq: Three times a day (TID) | ORAL | Status: DC

## 2015-04-16 MED ORDER — FLUTICASONE PROPIONATE 50 MCG/ACT NA SUSP: 2.0000 | Freq: Every day | NASAL | Status: DC

## 2015-04-16 NOTE — ED Notes (Signed)
Sore throat and cough for a week.

## 2015-04-16 NOTE — ED Notes (Signed)
C/o congestion x 1 week,  Cough and sorethroat x 1 day  Throat pain when swallowing

## 2015-04-16 NOTE — ED Provider Notes (Signed)
CSN: [id number]     Arrival date & time 04/16/15  1755 History   First MD Initiated Contact with Patient 04/16/15 1943     Chief Complaint  Patient presents with  . Sore Throat  . Cough     (Consider location/radiation/quality/duration/timing/severity/associated sxs/prior Treatment) HPI  Pt is a 29yo female presenting to ED with c/o gradually worsening cough and congestion for 1 week, associated sore throat that is worse with swallowing.  Pt also reports losing her voice yesterday.  She has been taking acetaminophen w/o relief.  Denies fever, chills, n/v/d. No sick contacts or recent travel. Denies CP or SOB.    Past Medical History  Diagnosis Date  . Preeclampsia   . Anxiety    Past Surgical History  Procedure Laterality Date  . Cesarean section    . Tonsillectomy     Family History  Problem Relation Age of Onset  . Diabetes Maternal Grandfather   . Stroke Paternal Grandfather    History  Substance Use Topics  . Smoking status: Never Smoker   . Smokeless tobacco: Never Used  . Alcohol Use: 0.5 oz/week    1 drink(s) per week   OB History    No data available     Review of Systems  Constitutional: Positive for fever. Negative for chills, diaphoresis, appetite change and fatigue.  HENT: Positive for congestion, rhinorrhea, sore throat and voice change. Negative for drooling, ear pain, sinus pressure and trouble swallowing.   Respiratory: Positive for cough. Negative for shortness of breath.   Gastrointestinal: Negative for nausea, vomiting, abdominal pain and diarrhea.  All other systems reviewed and are negative.     Allergies  Review of patient's allergies indicates no known allergies.  Home Medications   Prior to Admission medications   Medication Sig Start Date End Date Taking? Authorizing Provider  benzonatate (TESSALON) 100 MG capsule Take 1 capsule (100 mg total) by mouth every 8 (eight) hours. 04/16/15   Junius FinnerErin O'Malley, PA-C  ciprofloxacin (CIPRO) 500 MG  tablet Take 1 tablet (500 mg total) by mouth 2 (two) times daily. 08/27/14   John Molpus, MD  fluconazole (DIFLUCAN) 150 MG tablet Take one as needed for vaginal yeast infection. May repeat in three days if needed. 08/27/14   John Molpus, MD  fluticasone (FLONASE) 50 MCG/ACT nasal spray Place 2 sprays into both nostrils daily. 02/20/15   Tatyana Kirichenko, PA-C  fluticasone (FLONASE) 50 MCG/ACT nasal spray Place 2 sprays into both nostrils daily. 04/16/15   Junius FinnerErin O'Malley, PA-C  guaiFENesin-dextromethorphan (ROBITUSSIN DM) 100-10 MG/5ML syrup Take 10 mLs by mouth every 6 (six) hours as needed for cough. 04/16/15   Junius FinnerErin O'Malley, PA-C  phenazopyridine (PYRIDIUM) 200 MG tablet Take 200 mg by mouth 3 (three) times daily as needed for pain.    Historical Provider, MD  pseudoephedrine (SUDAFED) 30 MG tablet Take 1 tablet (30 mg total) by mouth every 4 (four) hours as needed for congestion. 02/20/15   Tatyana Kirichenko, PA-C   BP 129/76 mmHg  Pulse 80  Temp(Src) 98.7 F (37.1 C) (Oral)  Resp 20  Ht 5\' 7"  (1.702 m)  Wt 200 lb (90.719 kg)  BMI 31.32 kg/m2  SpO2 100%  LMP 04/02/2015 Physical Exam  Constitutional: She appears well-developed and well-nourished. No distress.  HENT:  Head: Normocephalic and atraumatic.  Right Ear: Hearing, tympanic membrane, external ear and ear canal normal.  Left Ear: Hearing, tympanic membrane, external ear and ear canal normal.  Nose: Mucosal edema and rhinorrhea present. Right  sinus exhibits no maxillary sinus tenderness and no frontal sinus tenderness. Left sinus exhibits no maxillary sinus tenderness and no frontal sinus tenderness.  Mouth/Throat: Uvula is midline and mucous membranes are normal. Posterior oropharyngeal erythema present. No oropharyngeal exudate, posterior oropharyngeal edema or tonsillar abscesses.  Eyes: Conjunctivae are normal. No scleral icterus.  Neck: Normal range of motion. Neck supple.  Cardiovascular: Normal rate, regular rhythm and normal  heart sounds.   Pulmonary/Chest: Effort normal and breath sounds normal. No respiratory distress. She has no wheezes. She has no rales. She exhibits no tenderness.  Abdominal: Soft. Bowel sounds are normal. She exhibits no distension and no mass. There is no tenderness. There is no rebound and no guarding.  Musculoskeletal: Normal range of motion.  Neurological: She is alert.  Skin: Skin is warm and dry. She is not diaphoretic.  Nursing note and vitals reviewed.   ED Course  Procedures (including critical care time) Labs Review Labs Reviewed  RAPID STREP SCREEN  CULTURE, GROUP A STREP    Imaging Review No results found.   EKG Interpretation None      MDM   Final diagnoses:  Nasal congestion  Laryngitis    Pt presenting to ED with c/o nasal congestion, cough, and sore throat. Rapid strep: negative. No evidence of tonsillar abscess. Will tx conservatively for laryngitis.  Rx: robitussin DM, tessalon, and flonase. Home care instructions provided. Advised to f/u with PCP for further evaluation of symptoms if not improving by the end of the week. Return precautions provided. Pt verbalized understanding and agreement with tx plan.     Junius Finner, PA-C 04/17/15 0140  Geoffery Lyons, MD 04/17/15 1910

## 2015-04-17 ENCOUNTER — Encounter (HOSPITAL_BASED_OUTPATIENT_CLINIC_OR_DEPARTMENT_OTHER): Payer: Self-pay | Admitting: *Deleted

## 2015-04-17 DIAGNOSIS — J069 Acute upper respiratory infection, unspecified: Secondary | ICD-10-CM | POA: Diagnosis present

## 2015-04-17 NOTE — ED Notes (Signed)
Was here yesterday and treated for a URI. States she feels no better today.

## 2015-04-18 ENCOUNTER — Emergency Department (HOSPITAL_BASED_OUTPATIENT_CLINIC_OR_DEPARTMENT_OTHER)
Admission: EM | Admit: 2015-04-18 | Discharge: 2015-04-18 | Discharge status: Against Medical Advice | Attending: Emergency Medicine | Admitting: Emergency Medicine

## 2015-04-18 LAB — CULTURE, GROUP A STREP: STREP A CULTURE: NEGATIVE

## 2015-05-31 ENCOUNTER — Encounter (HOSPITAL_BASED_OUTPATIENT_CLINIC_OR_DEPARTMENT_OTHER): Payer: Self-pay

## 2015-05-31 ENCOUNTER — Emergency Department (HOSPITAL_BASED_OUTPATIENT_CLINIC_OR_DEPARTMENT_OTHER)
Admission: EM | Admit: 2015-05-31 | Discharge: 2015-05-31 | Disposition: A | Discharge status: Home / Self Care | Attending: Emergency Medicine | Admitting: Emergency Medicine

## 2015-05-31 ENCOUNTER — Emergency Department (HOSPITAL_BASED_OUTPATIENT_CLINIC_OR_DEPARTMENT_OTHER)

## 2015-05-31 DIAGNOSIS — M545 Low back pain, unspecified: Secondary | ICD-10-CM

## 2015-05-31 DIAGNOSIS — N644 Mastodynia: Secondary | ICD-10-CM | POA: Diagnosis not present

## 2015-05-31 DIAGNOSIS — Z8659 Personal history of other mental and behavioral disorders: Secondary | ICD-10-CM | POA: Diagnosis not present

## 2015-05-31 DIAGNOSIS — J0110 Acute frontal sinusitis, unspecified: Secondary | ICD-10-CM | POA: Diagnosis not present

## 2015-05-31 DIAGNOSIS — Z3202 Encounter for pregnancy test, result negative: Secondary | ICD-10-CM | POA: Diagnosis not present

## 2015-05-31 DIAGNOSIS — Z7951 Long term (current) use of inhaled steroids: Secondary | ICD-10-CM | POA: Insufficient documentation

## 2015-05-31 DIAGNOSIS — R51 Headache: Secondary | ICD-10-CM | POA: Diagnosis present

## 2015-05-31 LAB — URINALYSIS, ROUTINE W REFLEX MICROSCOPIC
BILIRUBIN URINE: NEGATIVE
GLUCOSE, UA: NEGATIVE mg/dL
Hgb urine dipstick: NEGATIVE
KETONES UR: NEGATIVE mg/dL
Leukocytes, UA: NEGATIVE
Nitrite: NEGATIVE
Protein, ur: NEGATIVE mg/dL
SPECIFIC GRAVITY, URINE: 1.035 — AB (ref 1.005–1.030)
Urobilinogen, UA: 1 mg/dL (ref 0.0–1.0)
pH: 6 (ref 5.0–8.0)

## 2015-05-31 LAB — PREGNANCY, URINE: Preg Test, Ur: NEGATIVE

## 2015-05-31 MED ORDER — NAPROXEN 250 MG PO TABS
500.0000 mg | ORAL_TABLET | Freq: Once | ORAL | Status: AC
  Administered 2015-05-31: 500 mg via ORAL
  Filled 2015-05-31: qty 2

## 2015-05-31 MED ORDER — AMOXICILLIN 500 MG PO CAPS: 500.0000 mg | ORAL_CAPSULE | Freq: Three times a day (TID) | ORAL | Status: DC

## 2015-05-31 NOTE — ED Notes (Signed)
C/o HA, breast tenderness, lower back pain and CP x 1 week

## 2015-05-31 NOTE — ED Notes (Signed)
Patient transported to X-ray 

## 2015-05-31 NOTE — Discharge Instructions (Signed)
You may take ibuprofen, naproxen or Tylenol for your pain. Take antibiotic to completion. Follow-up with your primary care physician. Continue using nasal spray and decongestants.  Breast Tenderness Breast tenderness is a common problem for women of all ages. Breast tenderness may cause mild discomfort to severe pain. It has a variety of causes. Your health care provider will find out the likely cause of your breast tenderness by examining your breasts, asking you about symptoms, and ordering some tests. Breast tenderness usually does not mean you have breast cancer. HOME CARE INSTRUCTIONS  Breast tenderness often can be handled at home. You can try:  Getting fitted for a new bra that provides more support, especially during exercise.  Wearing a more supportive bra or sports bra while sleeping when your breasts are very tender.  If you have a breast injury, apply ice to the area:  Put ice in a plastic bag.  Place a towel between your skin and the bag.  Leave the ice on for 20 minutes, 2-3 times a day.  If your breasts are too full of milk as a result of breastfeeding, try:  Expressing milk either by hand or with a breast pump.  Applying a warm compress to the breasts for relief.  Taking over-the-counter pain relievers, if approved by your health care provider.  Taking other medicines that your health care provider prescribes. These may include antibiotic medicines or birth control pills. Over the long term, your breast tenderness might be eased if you:  Cut down on caffeine.  Reduce the amount of fat in your diet. Keep a log of the days and times when your breasts are most tender. This will help you and your health care provider find the cause of the tenderness and how to relieve it. Also, learn how to do breast exams at home. This will help you notice if you have an unusual growth or lump that could cause tenderness. SEEK MEDICAL CARE IF:   Any part of your breast is hard, red,  and hot to the touch. This could be a sign of infection.  Fluid is coming out of your nipples (and you are not breastfeeding). Especially watch for blood or pus.  You have a fever as well as breast tenderness.  You have a new or painful lump in your breast that remains after your menstrual period ends.  You have tried to take care of the pain at home, but it has not gone away.  Your breast pain is getting worse, or the pain is making it hard to do the things you usually do during your day. Document Released: 11/20/2008 Document Revised: 08/10/2013 Document Reviewed: 07/07/2013 [facility] Patient Information 2015 [place], [place]. This information is not intended to replace advice given to you by your health care provider. Make sure you discuss any questions you have with your health care provider.  Back Pain, Adult Low back pain is very common. About 1 in 5 people have back pain.The cause of low back pain is rarely dangerous. The pain often gets better over time.About half of people with a sudden onset of back pain feel better in just 2 weeks. About 8 in 10 people feel better by 6 weeks.  CAUSES Some common causes of back pain include:  Strain of the muscles or ligaments supporting the spine.  Wear and tear (degeneration) of the spinal discs.  Arthritis.  Direct injury to the back. DIAGNOSIS Most of the time, the direct cause of low back pain is not  known.However, back pain can be treated effectively even when the exact cause of the pain is unknown.Answering your caregiver's questions about your overall health and symptoms is one of the most accurate ways to make sure the cause of your pain is not dangerous. If your caregiver needs more information, he or she may order lab work or imaging tests (X-rays or MRIs).However, even if imaging tests show changes in your back, this usually does not require surgery. HOME CARE INSTRUCTIONS For many people, back pain returns.Since low back pain  is rarely dangerous, it is often a condition that people can learn to [facility] their own.   Remain active. It is stressful on the back to sit or stand in one place. Do not sit, drive, or stand in one place for more than 30 minutes at a time. Take short walks on level surfaces as soon as pain allows.Try to increase the length of time you walk each day.  Do not stay in bed.Resting more than 1 or 2 days can delay your recovery.  Do not avoid exercise or work.Your body is made to move.It is not dangerous to be active, even though your back may hurt.Your back will likely heal faster if you return to being active before your pain is gone.  Pay attention to your body when you bend and lift. Many people have less discomfortwhen lifting if they bend their knees, keep the load close to their bodies,and avoid twisting. Often, the most comfortable positions are those that put less stress on your recovering back.  Find a comfortable position to sleep. Use a firm mattress and lie on your side with your knees slightly bent. If you lie on your back, put a pillow under your knees.  Only take over-the-counter or prescription medicines as directed by your caregiver. Over-the-counter medicines to reduce pain and inflammation are often the most helpful.Your caregiver may prescribe muscle relaxant drugs.These medicines help dull your pain so you can more quickly return to your normal activities and healthy exercise.  Put ice on the injured area.  Put ice in a plastic bag.  Place a towel between your skin and the bag.  Leave the ice on for 15-20 minutes, 03-04 times a day for the first 2 to 3 days. After that, ice and heat may be alternated to reduce pain and spasms.  Ask your caregiver about trying back exercises and gentle massage. This may be of some benefit.  Avoid feeling anxious or stressed.Stress increases muscle tension and can worsen back pain.It is important to recognize when you are anxious  or stressed and learn ways to manage it.Exercise is a great option. SEEK MEDICAL CARE IF:  You have pain that is not relieved with rest or medicine.  You have pain that does not improve in 1 week.  You have new symptoms.  You are generally not feeling well. SEEK IMMEDIATE MEDICAL CARE IF:   You have pain that radiates from your back into your legs.  You develop new bowel or bladder control problems.  You have unusual weakness or numbness in your arms or legs.  You develop nausea or vomiting.  You develop abdominal pain.  You feel faint. Document Released: 12/08/2005 Document Revised: 06/08/2012 Document Reviewed: 04/11/2014 [facility] Patient Information 2015 [place], [place]. This information is not intended to replace advice given to you by your health care provider. Make sure you discuss any questions you have with your health care provider.  Sinusitis Sinusitis is redness, soreness, and inflammation of the  paranasal sinuses. Paranasal sinuses are air pockets within the bones of your face (beneath the eyes, the middle of the forehead, or above the eyes). In healthy paranasal sinuses, mucus is able to drain out, and air is able to circulate through them by way of your nose. However, when your paranasal sinuses are inflamed, mucus and air can become trapped. This can allow bacteria and other germs to grow and cause infection. Sinusitis can develop quickly and last only a short time (acute) or continue over a long period (chronic). Sinusitis that lasts for more than 12 weeks is considered chronic.  CAUSES  Causes of sinusitis include:  Allergies.  Structural abnormalities, such as displacement of the cartilage that separates your nostrils (deviated septum), which can decrease the air flow through your nose and sinuses and affect sinus drainage.  Functional abnormalities, such as when the small hairs (cilia) that line your sinuses and help remove mucus do not work properly or are not  present. SIGNS AND SYMPTOMS  Symptoms of acute and chronic sinusitis are the same. The primary symptoms are pain and pressure around the affected sinuses. Other symptoms include:  Upper toothache.  Earache.  Headache.  Bad breath.  Decreased sense of smell and taste.  A cough, which worsens when you are lying flat.  Fatigue.  Fever.  Thick drainage from your nose, which often is green and may contain pus (purulent).  Swelling and warmth over the affected sinuses. DIAGNOSIS  Your health care provider will perform a physical exam. During the exam, your health care provider may:  Look in your nose for signs of abnormal growths in your nostrils (nasal polyps).  Tap over the affected sinus to check for signs of infection.  View the inside of your sinuses (endoscopy) using an imaging device that has a light attached (endoscope). If your health care provider suspects that you have chronic sinusitis, one or more of the following tests may be recommended:  Allergy tests.  Nasal culture. A sample of mucus is taken from your nose, sent to a lab, and screened for bacteria.  Nasal cytology. A sample of mucus is taken from your nose and examined by your health care provider to determine if your sinusitis is related to an allergy. TREATMENT  Most cases of acute sinusitis are related to a viral infection and will resolve on their own within 10 days. Sometimes medicines are prescribed to help relieve symptoms (pain medicine, decongestants, nasal steroid sprays, or saline sprays).  However, for sinusitis related to a bacterial infection, your health care provider will prescribe antibiotic medicines. These are medicines that will help kill the bacteria causing the infection.  Rarely, sinusitis is caused by a fungal infection. In theses cases, your health care provider will prescribe antifungal medicine. For some cases of chronic sinusitis, surgery is needed. Generally, these are cases in which  sinusitis recurs more than 3 times per year, despite other treatments. HOME CARE INSTRUCTIONS   Drink plenty of water. Water helps thin the mucus so your sinuses can drain more easily.  Use a humidifier.  Inhale steam 3 to 4 times a day (for example, sit in the bathroom with the shower running).  Apply a warm, moist washcloth to your face 3 to 4 times a day, or as directed by your health care provider.  Use saline nasal sprays to help moisten and clean your sinuses.  Take medicines only as directed by your health care provider.  If you were prescribed either an antibiotic or  antifungal medicine, finish it all even if you start to feel better. SEEK IMMEDIATE MEDICAL CARE IF:  You have increasing pain or severe headaches.  You have nausea, vomiting, or drowsiness.  You have swelling around your face.  You have vision problems.  You have a stiff neck.  You have difficulty breathing. MAKE SURE YOU:   Understand these instructions.  Will watch your condition.  Will get help right away if you are not doing well or get worse. Document Released: 12/08/2005 Document Revised: 04/24/2014 Document Reviewed: 12/23/2011 [facility] Patient Information 2015 [place], [place]. This information is not intended to replace advice given to you by your health care provider. Make sure you discuss any questions you have with your health care provider.

## 2015-05-31 NOTE — ED Provider Notes (Signed)
CSN: [id number]     Arrival date & time 05/31/15  1900 History  This chart was scribed for Deborah Skeen, PA-C, working with Layla Maw Ward, DO by Octavia Heir, ED Scribe. This patient was seen in room MH12/MH12 and the patient's care was started at 7:31 PM.    Chief Complaint  Patient presents with  . Headache     The history is provided by the patient. No language interpreter was used.    HPI Comments: Deborah Rodriguez is a 29 y.o. female who presents to the Emergency Department complaining of constant, gradual worsening headache onset one week ago. Headache is frontal, a feeling of sinus pressure, worse when bending forward. Pt has associated congestion, bilateral breast pain and lower back pain. She notes the feeling in her chest is a "nagging, sharp" pain.  Pt notes her left breast is more painful than her right. She reports being sick about 3 weeks ago was given Flonase and Robitussin for her congestion with minimal relief. Pt has her last menstrual cycle 6 days ago.  Pt denies fevers, rhinorhea, and coughing, abdominal pain, and numbness/tingling in legs.   Past Medical History  Diagnosis Date  . Preeclampsia   . Anxiety    Past Surgical History  Procedure Laterality Date  . Cesarean section    . Tonsillectomy     Family History  Problem Relation Age of Onset  . Diabetes Maternal Grandfather   . Stroke Paternal Grandfather    History  Substance Use Topics  . Smoking status: Never Smoker   . Smokeless tobacco: Never Used  . Alcohol Use: 0.5 oz/week    1 drink(s) per week   OB History    No data available     Review of Systems  HENT: Positive for congestion.   Musculoskeletal: Positive for back pain.  Neurological: Positive for headaches.  All other systems reviewed and are negative.     Allergies  Review of patient's allergies indicates no known allergies.  Home Medications   Prior to Admission medications   Medication Sig Start Date End Date Taking?  Authorizing Provider  amoxicillin (AMOXIL) 500 MG capsule Take 1 capsule (500 mg total) by mouth 3 (three) times daily. 05/31/15   Mikell Kazlauskas M Keonta Monceaux, PA-C  benzonatate (TESSALON) 100 MG capsule Take 1 capsule (100 mg total) by mouth every 8 (eight) hours. 04/16/15   Junius Finner, PA-C  ciprofloxacin (CIPRO) 500 MG tablet Take 1 tablet (500 mg total) by mouth 2 (two) times daily. 08/27/14   John Molpus, MD  fluconazole (DIFLUCAN) 150 MG tablet Take one as needed for vaginal yeast infection. May repeat in three days if needed. 08/27/14   John Molpus, MD  fluticasone (FLONASE) 50 MCG/ACT nasal spray Place 2 sprays into both nostrils daily. 02/20/15   Tatyana Kirichenko, PA-C  fluticasone (FLONASE) 50 MCG/ACT nasal spray Place 2 sprays into both nostrils daily. 04/16/15   Junius Finner, PA-C  guaiFENesin-dextromethorphan (ROBITUSSIN DM) 100-10 MG/5ML syrup Take 10 mLs by mouth every 6 (six) hours as needed for cough. 04/16/15   Junius Finner, PA-C  phenazopyridine (PYRIDIUM) 200 MG tablet Take 200 mg by mouth 3 (three) times daily as needed for pain.    Historical Provider, MD  pseudoephedrine (SUDAFED) 30 MG tablet Take 1 tablet (30 mg total) by mouth every 4 (four) hours as needed for congestion. 02/20/15   Jaynie Crumble, PA-C   Triage vitals: BP 112/53 mmHg  Pulse 80  Temp(Src) 98.2 F (36.8 C) (Oral)  Resp  18  Ht  (1.702 m)  Wt 205 lb (92.987 kg)  BMI 32.10 kg/m2  SpO2 98%  LMP 05/25/2015 Physical Exam  Constitutional: She is oriented to person, place, and time. She appears well-developed and well-nourished. No distress.  HENT:  Head: Normocephalic and atraumatic.  Nasal congestion, mucosal edema, postnasal drip. Bilateral frontal sinus tenderness.  Eyes: Conjunctivae and EOM are normal. Pupils are equal, round, and reactive to light.  Neck: Normal range of motion. Neck supple. No spinous process tenderness and no muscular tenderness present.  No meningeal signs.  Cardiovascular: Normal rate,  regular rhythm, normal heart sounds and intact distal pulses.   Pulmonary/Chest: Effort normal and breath sounds normal. No respiratory distress.  Abdominal: Soft. Bowel sounds are normal. There is no tenderness.  Genitourinary:  Right breast TTP around 3:00. No palpable masses. No nipple discharge.  Musculoskeletal: Normal range of motion. She exhibits no edema.  TTP across lower back, more so bilateral lumbar paraspinal muscles.  Lymphadenopathy:    She has no cervical adenopathy.  Neurological: She is alert and oriented to person, place, and time. She has normal strength. No cranial nerve deficit or sensory deficit. Coordination and gait normal.  Strength lower extremities 5/5 and equal bilateral. Sensation intact. Normal gait.  Skin: Skin is warm and dry. No rash noted. She is not diaphoretic.  Psychiatric: She has a normal mood and affect. Her behavior is normal.  Nursing note and vitals reviewed.   ED Course  Procedures  DIAGNOSTIC STUDIES: Oxygen Saturation is 98% on RA, normal by my interpretation.  COORDINATION OF CARE:  7:37 PM Discussed treatment plan which includes low back x-ray and chest x-ray, antibiotic with pt at bedside and pt agreed to plan.  Labs Review Labs Reviewed  URINALYSIS, ROUTINE W REFLEX MICROSCOPIC (NOT AT [facility]) - Abnormal; Notable for the following:    APPearance CLOUDY (*)    Specific Gravity, Urine 1.035 (*)    All other components within normal limits  PREGNANCY, URINE    Imaging Review Dg Chest 2 View  05/31/2015   CLINICAL DATA:  Headache. Initial encounter. Onset of headache 1 week ago.  EXAM: CHEST  2 VIEW  COMPARISON:  08/30/2014.  FINDINGS: Cardiopericardial silhouette within normal limits. Mediastinal contours normal. Trachea midline. No airspace disease or effusion.  IMPRESSION: No active cardiopulmonary disease.   Electronically Signed   By: Andreas Newport M.D.   On: 05/31/2015 20:57   Dg Lumbar Spine Complete  05/31/2015   CLINICAL  DATA:  Lumbago/low back pain. Symptoms for 1 week. Initial encounter.  EXAM: LUMBAR SPINE - COMPLETE 4+ VIEW  COMPARISON:  None.  FINDINGS: There is no evidence of lumbar spine fracture. Alignment is normal. Intervertebral disc spaces are maintained.  IMPRESSION: Negative.   Electronically Signed   By: Andreas Newport M.D.   On: 05/31/2015 20:58     EKG Interpretation None      MDM   Final diagnoses:  Subacute frontal sinusitis  Bilateral low back pain without sciatica  Breast pain   Nontoxic appearing, NAD. AF VSS. Headache symptoms consistent with sinus congestion. Given this has been ongoing for about 3 weeks, despite treatment with over-the-counter medications, will treat with Tobi Bastos biotics. Advised her to continue nasal spray along with using decongestions. Regarding back pain, no red flags concerning patient's back pain. No signs or symptoms of central cord compression or cauda equina. Ambulates without difficulty. Chest x-ray obtained given that her breasts were only minimally tender on the right  without any masses, and states she felt it was deeper into her chest, chest x-ray negative. UA and pregnancy negative. Advised her to follow-up with her PCP and OB/GYN. Rx amoxicillin. Advised NSAIDs, ice/heat for back pain. Stable for discharge. Return precautions given. Patient states understanding of treatment care plan and is agreeable.  I personally performed the services described in this documentation, which was scribed in my presence. The recorded information has been reviewed and is accurate.  Kathrynn Speed, PA-C 06/02/15 1017  Kristen N Ward, DO 06/03/15 [phone]

## 2015-06-14 ENCOUNTER — Encounter (HOSPITAL_BASED_OUTPATIENT_CLINIC_OR_DEPARTMENT_OTHER): Payer: Self-pay

## 2015-06-14 ENCOUNTER — Emergency Department (HOSPITAL_BASED_OUTPATIENT_CLINIC_OR_DEPARTMENT_OTHER)
Admission: EM | Admit: 2015-06-14 | Discharge: 2015-06-14 | Disposition: A | Discharge status: Home / Self Care | Attending: Emergency Medicine | Admitting: Emergency Medicine

## 2015-06-14 DIAGNOSIS — Z79899 Other long term (current) drug therapy: Secondary | ICD-10-CM | POA: Diagnosis not present

## 2015-06-14 DIAGNOSIS — H109 Unspecified conjunctivitis: Secondary | ICD-10-CM | POA: Diagnosis not present

## 2015-06-14 DIAGNOSIS — Z8659 Personal history of other mental and behavioral disorders: Secondary | ICD-10-CM | POA: Diagnosis not present

## 2015-06-14 DIAGNOSIS — H578 Other specified disorders of eye and adnexa: Secondary | ICD-10-CM | POA: Diagnosis present

## 2015-06-14 MED ORDER — FLUORESCEIN SODIUM 1 MG OP STRP
1.0000 | ORAL_STRIP | Freq: Once | OPHTHALMIC | Status: DC
  Filled 2015-06-14: qty 1

## 2015-06-14 MED ORDER — TETRACAINE HCL 0.5 % OP SOLN
2.0000 [drp] | Freq: Once | OPHTHALMIC | Status: DC
  Filled 2015-06-14: qty 2

## 2015-06-14 MED ORDER — CIPROFLOXACIN HCL 0.3 % OP OINT: TOPICAL_OINTMENT | Freq: Three times a day (TID) | OPHTHALMIC | Status: DC

## 2015-06-14 MED ORDER — CIPROFLOXACIN HCL 0.3 % OP OINT: TOPICAL_OINTMENT | Freq: Two times a day (BID) | OPHTHALMIC | Status: DC

## 2015-06-14 NOTE — ED Provider Notes (Signed)
CSN: [id number]     Arrival date & time 06/14/15  1956 History   First MD Initiated Contact with Patient 06/14/15 2028     Chief Complaint  Patient presents with  . Eye Problem     (Consider location/radiation/quality/duration/timing/severity/associated sxs/prior Treatment) HPI Comments: 29 year old female complaining of left eye redness and pain 2 days. States the redness has increased over the past 2 days, and is only slightly painful. This morning, she woke up with her eye crusted with green drainage. No aggravating or alleviating factors. Denies fevers. No vision changes. Assuming she noticed her eye redness, she took out her contacts and has not put them back in. Denies any foreign body sensation.  Patient is a 29 y.o. female presenting with eye problem. The history is provided by the patient.  Eye Problem Associated symptoms: discharge and redness   Associated symptoms: no headaches     Past Medical History  Diagnosis Date  . Preeclampsia   . Anxiety    Past Surgical History  Procedure Laterality Date  . Cesarean section    . Tonsillectomy     Family History  Problem Relation Age of Onset  . Diabetes Maternal Grandfather   . Stroke Paternal Grandfather    History  Substance Use Topics  . Smoking status: Never Smoker   . Smokeless tobacco: Never Used  . Alcohol Use: Yes   OB History    No data available     Review of Systems  Constitutional: Negative for fever.  Eyes: Positive for pain, discharge and redness.  Skin: Negative for rash.  Neurological: Negative for headaches.      Allergies  Review of patient's allergies indicates no known allergies.  Home Medications   Prior to Admission medications   Medication Sig Start Date End Date Taking? Authorizing Provider  Cholecalciferol (VITAMIN D PO) Take by mouth.   Yes Historical Provider, MD  Multiple Vitamin (MULTIVITAMIN) tablet Take 1 tablet by mouth daily.   Yes Historical Provider, MD  ciprofloxacin  (CILOXAN) 0.3 % ophthalmic ointment Place into the left eye 2 (two) times daily. x7 days 06/14/15   Kathrynn Speed, PA-C   BP 121/77 mmHg  Pulse 84  Temp(Src) 98.4 F (36.9 C) (Oral)  Resp 20  Ht 5\' 6"  (1.676 m)  Wt 205 lb (92.987 kg)  BMI 33.10 kg/m2  SpO2 100%  LMP 05/25/2015 Physical Exam  Constitutional: She is oriented to person, place, and time. She appears well-developed and well-nourished. No distress.  HENT:  Head: Normocephalic and atraumatic.  Mouth/Throat: Oropharynx is clear and moist.  Eyes: EOM are normal. Pupils are equal, round, and reactive to light. Left eye exhibits exudate. Left conjunctiva is injected.  Neck: Normal range of motion. Neck supple.  Cardiovascular: Normal rate, regular rhythm and normal heart sounds.   Pulmonary/Chest: Effort normal and breath sounds normal. No respiratory distress.  Musculoskeletal: Normal range of motion. She exhibits no edema.  Neurological: She is alert and oriented to person, place, and time. No sensory deficit.  Skin: Skin is warm and dry.  Psychiatric: She has a normal mood and affect. Her behavior is normal.  Nursing note and vitals reviewed.   ED Course  Procedures (including critical care time) Labs Review Labs Reviewed - No data to display  Imaging Review No results found.   EKG Interpretation None      MDM   Final diagnoses:  Left conjunctivitis   NAD. Advised the patient to not put her contacts in until completing  treatment with Ciloxan eye drops and f/u with her ophthalmologist. Infection care/precautions discussed. Stable for discharge. Return precautions given. Patient states understanding of treatment care plan and is agreeable.  Kathrynn Speed, PA-C 06/14/15 2052  Glynn Octave, MD 06/14/15 [phone]

## 2015-06-14 NOTE — Discharge Instructions (Signed)
Apply eyedrops into the left eye as directed for 7 days. Follow-up with your eye doctor within 7 days. Do not use contacts until follow-up with your eye doctor.  Bacterial Conjunctivitis Bacterial conjunctivitis, commonly called pink eye, is an inflammation of the clear membrane that covers the white part of the eye (conjunctiva). The inflammation can also happen on the underside of the eyelids. The blood vessels in the conjunctiva become inflamed, causing the eye to become red or pink. Bacterial conjunctivitis may spread easily from one eye to another and from person to person (contagious).  CAUSES  Bacterial conjunctivitis is caused by bacteria. The bacteria may come from your own skin, your upper respiratory tract, or from someone else with bacterial conjunctivitis. SYMPTOMS  The normally white color of the eye or the underside of the eyelid is usually pink or red. The pink eye is usually associated with irritation, tearing, and some sensitivity to light. Bacterial conjunctivitis is often associated with a thick, yellowish discharge from the eye. The discharge may turn into a crust on the eyelids overnight, which causes your eyelids to stick together. If a discharge is present, there may also be some blurred vision in the affected eye. DIAGNOSIS  Bacterial conjunctivitis is diagnosed by your caregiver through an eye exam and the symptoms that you report. Your caregiver looks for changes in the surface tissues of your eyes, which may point to the specific type of conjunctivitis. A sample of any discharge may be collected on a cotton-tip swab if you have a severe case of conjunctivitis, if your cornea is affected, or if you keep getting repeat infections that do not respond to treatment. The sample will be sent to a lab to see if the inflammation is caused by a bacterial infection and to see if the infection will respond to antibiotic medicines. TREATMENT   Bacterial conjunctivitis is treated with  antibiotics. Antibiotic eyedrops are most often used. However, antibiotic ointments are also available. Antibiotics pills are sometimes used. Artificial tears or eye washes may ease discomfort. HOME CARE INSTRUCTIONS   To ease discomfort, apply a cool, clean washcloth to your eye for 10-20 minutes, 3-4 times a day.  Gently wipe away any drainage from your eye with a warm, wet washcloth or a cotton ball.  Wash your hands often with soap and water. Use paper towels to dry your hands.  Do not share towels or washcloths. This may spread the infection.  Change or wash your pillowcase every day.  You should not use eye makeup until the infection is gone.  Do not operate machinery or drive if your vision is blurred.  Stop using contact lenses. Ask your caregiver how to sterilize or replace your contacts before using them again. This depends on the type of contact lenses that you use.  When applying medicine to the infected eye, do not touch the edge of your eyelid with the eyedrop bottle or ointment tube. SEEK IMMEDIATE MEDICAL CARE IF:   Your infection has not improved within 3 days after beginning treatment.  You had yellow discharge from your eye and it returns.  You have increased eye pain.  Your eye redness is spreading.  Your vision becomes blurred.  You have a fever or persistent symptoms for more than 2-3 days.  You have a fever and your symptoms suddenly get worse.  You have facial pain, redness, or swelling. MAKE SURE YOU:   Understand these instructions.  Will watch your condition.  Will get help right away  if you are not doing well or get worse. Document Released: 12/08/2005 Document Revised: 04/24/2014 Document Reviewed: 05/10/2012 [facility] Patient Information 2015 [place], [place]. This information is not intended to replace advice given to you by your health care provider. Make sure you discuss any questions you have with your health care provider.

## 2015-06-14 NOTE — ED Notes (Signed)
Left eye redness, pain x 2 days

## 2015-09-17 ENCOUNTER — Emergency Department (HOSPITAL_BASED_OUTPATIENT_CLINIC_OR_DEPARTMENT_OTHER)
Admission: EM | Admit: 2015-09-17 | Discharge: 2015-09-17 | Disposition: A | Discharge status: Home / Self Care | Attending: Physician Assistant | Admitting: Physician Assistant

## 2015-09-17 ENCOUNTER — Encounter (HOSPITAL_BASED_OUTPATIENT_CLINIC_OR_DEPARTMENT_OTHER): Payer: Self-pay | Admitting: Emergency Medicine

## 2015-09-17 DIAGNOSIS — Z8659 Personal history of other mental and behavioral disorders: Secondary | ICD-10-CM | POA: Insufficient documentation

## 2015-09-17 DIAGNOSIS — H9203 Otalgia, bilateral: Secondary | ICD-10-CM | POA: Diagnosis present

## 2015-09-17 DIAGNOSIS — H6690 Otitis media, unspecified, unspecified ear: Secondary | ICD-10-CM

## 2015-09-17 DIAGNOSIS — H6693 Otitis media, unspecified, bilateral: Secondary | ICD-10-CM | POA: Insufficient documentation

## 2015-09-17 MED ORDER — AMOXICILLIN 500 MG PO CAPS
500.0000 mg | ORAL_CAPSULE | Freq: Once | ORAL | Status: AC
  Administered 2015-09-17: 500 mg via ORAL
  Filled 2015-09-17: qty 1

## 2015-09-17 MED ORDER — AMOXICILLIN 500 MG PO CAPS: 500.0000 mg | ORAL_CAPSULE | Freq: Two times a day (BID) | ORAL | Status: DC

## 2015-09-17 NOTE — ED Notes (Signed)
Patient reports that she is having on and off bilateral ear pain and sinus drainage

## 2015-09-17 NOTE — ED Notes (Signed)
MD at bedside. 

## 2015-09-17 NOTE — ED Provider Notes (Signed)
CSN: [id number]   Arrival date & time 09/17/15 2103  History  By signing my name below, I, Deborah Rodriguez, attest that this documentation has been prepared under the direction and in the presence of Courteney Randall An, MD. Electronically Signed: Bethel Rodriguez, ED Scribe. 09/17/2015. 10:35 PM.  Chief Complaint  Patient presents with  . Otalgia    HPI The history is provided by the patient. No language interpreter was used.   Deborah Rodriguez is a 29 y.o. female who presents to the Emergency Department complaining of constant bilateral ear pain with onset today. She describes the pain as severe and shooting. Associated symptoms include intermittent sinus pain that she treats with Flonase. Pt denies fever.   Past Medical History  Diagnosis Date  . Preeclampsia   . Anxiety     Past Surgical History  Procedure Laterality Date  . Cesarean section    . Tonsillectomy      Family History  Problem Relation Age of Onset  . Diabetes Maternal Grandfather   . Stroke Paternal Grandfather     Social History  Substance Use Topics  . Smoking status: Never Smoker   . Smokeless tobacco: Never Used  . Alcohol Use: Yes     Review of Systems  Constitutional: Negative for fever.  HENT: Positive for ear pain and sinus pressure.   All other systems reviewed and are negative.  Home Medications   Prior to Admission medications   Medication Sig Start Date End Date Taking? Authorizing Provider  Cholecalciferol (VITAMIN D PO) Take by mouth.    Historical Provider, MD  ciprofloxacin (CILOXAN) 0.3 % ophthalmic ointment Place into the left eye 2 (two) times daily. x7 days 06/14/15   Kathrynn Speed, PA-C  Multiple Vitamin (MULTIVITAMIN) tablet Take 1 tablet by mouth daily.    Historical Provider, MD    Allergies  Review of patient's allergies indicates no known allergies.  Triage Vitals: BP 129/87 mmHg  Pulse 75  Temp(Src) 98.4 F (36.9 C) (Oral)  Resp 16  Ht  (1.702 m)  Wt 199 lb  (90.266 kg)  BMI 31.16 kg/m2  SpO2 100%  LMP 09/17/2015  Physical Exam  Constitutional: She is oriented to person, place, and time. She appears well-developed and well-nourished. No distress.  HENT:  Head: Normocephalic and atraumatic.  Mild erythema of the turbinates Right TM opacified Left TM clear  Eyes: EOM are normal.  Neck: Normal range of motion.  Cardiovascular: Normal rate, regular rhythm and normal heart sounds.   Pulmonary/Chest: Effort normal and breath sounds normal.  Abdominal: Soft. She exhibits no distension. There is no tenderness.  Musculoskeletal: Normal range of motion.  Neurological: She is alert and oriented to person, place, and time.  Skin: Skin is warm and dry.  Psychiatric: She has a normal mood and affect. Judgment normal.  Nursing note and vitals reviewed.   ED Course  Procedures   DIAGNOSTIC STUDIES: Oxygen Saturation is 100% on RA, normal by my interpretation.    COORDINATION OF CARE: 10:34 PM Discussed treatment plan which includes abx with pt at bedside and pt agreed to plan.  Labs Reviewed - No data to display  Imaging Review No results found.   MDM   Final diagnoses:  None   Patient is a 29 year old female presenting with earache. Patient has a pain onset today. Also has chronic sinus pain. Mild opacity of the right TM we'll treat with short course of amoxicillin and have her follow-up with regular physician.  I  personally performed the services described in this documentation, which was scribed in my presence. The recorded information has been reviewed and is accurate.    Courteney Randall An, MD 09/17/15 2239

## 2015-09-25 ENCOUNTER — Emergency Department (HOSPITAL_COMMUNITY)
Admission: EM | Admit: 2015-09-25 | Discharge: 2015-09-25 | Disposition: A | Discharge status: Home / Self Care | Attending: Emergency Medicine | Admitting: Emergency Medicine

## 2015-09-25 ENCOUNTER — Encounter (HOSPITAL_COMMUNITY): Payer: Self-pay | Admitting: *Deleted

## 2015-09-25 DIAGNOSIS — H9203 Otalgia, bilateral: Secondary | ICD-10-CM

## 2015-09-25 DIAGNOSIS — Z792 Long term (current) use of antibiotics: Secondary | ICD-10-CM | POA: Diagnosis not present

## 2015-09-25 DIAGNOSIS — Z8659 Personal history of other mental and behavioral disorders: Secondary | ICD-10-CM | POA: Diagnosis not present

## 2015-09-25 MED ORDER — AMOXICILLIN-POT CLAVULANATE 875-125 MG PO TABS: 1.0000 | ORAL_TABLET | Freq: Two times a day (BID) | ORAL | Status: DC

## 2015-09-25 NOTE — ED Notes (Signed)
Just completed Rx of Amoxicillin for bilateral ear pain. States she has had NO improvement.

## 2015-09-25 NOTE — ED Provider Notes (Signed)
CSN: [id number]     Arrival date & time 09/25/15  1128 History  By signing my name below, I, Freida Busman, attest that this documentation has been prepared under the direction and in the presence of non-physician practitioner, Joycie Peek, PA-C. Electronically Signed: Freida Busman, Scribe. 09/25/2015. 12:39 PM.     Chief Complaint  Patient presents with  . Otalgia    The history is provided by the patient. No language interpreter was used.    HPI Comments:  Deborah Rodriguez is a 29 y.o. female who presents to the Emergency Department complaining of bilateral ear pain for about 1 week with greater pain in the right ear than the left. She reports associated HA and congestion that started before her ear pain began . She denies fever and drainage from the ear. She was seen in the ED on 09/17/2015 for the same pain and diagnosed with otitis media. She was discharged with amoxicillin which she has completed without improvement. No alleviating factors noted.   Past Medical History  Diagnosis Date  . Preeclampsia   . Anxiety    Past Surgical History  Procedure Laterality Date  . Cesarean section    . Tonsillectomy     Family History  Problem Relation Age of Onset  . Diabetes Maternal Grandfather   . Stroke Paternal Grandfather    Social History  Substance Use Topics  . Smoking status: Never Smoker   . Smokeless tobacco: Never Used  . Alcohol Use: Yes   OB History    No data available     Review of Systems  Constitutional: Negative for fever.  HENT: Positive for congestion and ear pain. Negative for ear discharge.   Neurological: Positive for headaches.  All other systems reviewed and are negative.     Allergies  Review of patient's allergies indicates no known allergies.  Home Medications   Prior to Admission medications   Medication Sig Start Date End Date Taking? Authorizing Provider  amoxicillin (AMOXIL) 500 MG capsule Take 1 capsule (500 mg total) by mouth 2  (two) times daily. 09/17/15   Courteney Lyn Mackuen, MD  amoxicillin-clavulanate (AUGMENTIN) 875-125 MG tablet Take 1 tablet by mouth every 12 (twelve) hours. 09/25/15   Joycie Peek, PA-C  Cholecalciferol (VITAMIN D PO) Take by mouth.    Historical Provider, MD  ciprofloxacin (CILOXAN) 0.3 % ophthalmic ointment Place into the left eye 2 (two) times daily. x7 days 06/14/15   Kathrynn Speed, PA-C  Multiple Vitamin (MULTIVITAMIN) tablet Take 1 tablet by mouth daily.    Historical Provider, MD   BP 130/74 mmHg  Pulse 82  Temp(Src) 98.4 F (36.9 C) (Oral)  Resp 18  SpO2 97%  LMP 09/17/2015 Physical Exam  Constitutional: She is oriented to person, place, and time. She appears well-developed and well-nourished. No distress.  HENT:  Head: Normocephalic and atraumatic.  R TM clear L TM pearly  Eyes: Conjunctivae are normal.  Neck: Normal range of motion. Neck supple.  No auricular adenopathy No frontal or maxillary sinus tenderness    Cardiovascular: Normal rate, regular rhythm and normal heart sounds.   Pulmonary/Chest: Effort normal. No respiratory distress.  Abdominal: She exhibits no distension.  Lymphadenopathy:    She has no cervical adenopathy.  Neurological: She is alert and oriented to person, place, and time.  Skin: Skin is warm and dry.  Psychiatric: She has a normal mood and affect.  Nursing note and vitals reviewed.   ED Course  Procedures   DIAGNOSTIC STUDIES:  Oxygen Saturation is 97% on RA, normal by my interpretation.    COORDINATION OF CARE:  12:28 PM Will discharge with different antibiotic. Discussed treatment plan with pt at bedside and pt agreed to plan.  Labs Review Labs Reviewed - No data to display  Imaging Review No results found.    EKG Interpretation None     Meds given in ED:  Medications - No data to display  Discharge Medication List as of 09/25/2015 12:37 PM    START taking these medications   Details  amoxicillin-clavulanate  (AUGMENTIN) 875-125 MG tablet Take 1 tablet by mouth every 12 (twelve) hours., Starting 09/25/2015, Until Discontinued, Print       Filed Vitals:   09/25/15 1133  BP: 130/74  Pulse: 82  Temp: 98.4 F (36.9 C)  TempSrc: Oral  Resp: 18  SpO2: 97%    MDM  Vitals stable - WNL -afebrile Pt resting comfortably in ED. PE--left TM slightly pearly but no overt evidence of infection. No ear drainage.  DDX--due to patient discomfort, slight change in left TM, we will change antibiotic to Augmentin. Also recommended Benadryl or other anti-histamine to help with possible congestion as source of symptoms. No other evidence of other acute or emergent pathology at this time. Overall, patient appears well, nontoxic with stable vital signs appropriate for discharge.  I discussed all relevant lab findings and imaging results with pt and they verbalized understanding. Discussed f/u with PCP within 48 hrs and return precautions, pt very amenable to plan.  Final diagnoses:  Otalgia of both ears    I personally performed the services described in this documentation, which was scribed in my presence. The recorded information has been reviewed and is accurate.     Joycie Peek, PA-C 09/26/15 1504  Laurence Spates, MD 09/26/15 [phone]

## 2015-09-25 NOTE — ED Notes (Signed)
Pt had sinus infection and ear infection and placed on abx.  Finished abx and  Both ears are still hurting

## 2015-09-25 NOTE — Discharge Instructions (Signed)
Please follow-up with your doctor for reevaluation in one week. Take your antibiotics as prescribed. Stay well-hydrated at home with plenty of water or Gatorade. Return to ED for worsening symptoms.  Otalgia The most common reason for this in children is an infection of the middle ear. Pain from the middle ear is usually caused by a build-up of fluid and pressure behind the eardrum. Pain from an earache can be sharp, dull, or burning. The pain may be temporary or constant. The middle ear is connected to the nasal passages by a short narrow tube called the Eustachian tube. The Eustachian tube allows fluid to drain out of the middle ear, and helps keep the pressure in your ear equalized. CAUSES  A cold or allergy can block the Eustachian tube with inflammation and the build-up of secretions. This is especially likely in small children, because their Eustachian tube is shorter and more horizontal. When the Eustachian tube closes, the normal flow of fluid from the middle ear is stopped. Fluid can accumulate and cause stuffiness, pain, hearing loss, and an ear infection if germs start growing in this area. SYMPTOMS  The symptoms of an ear infection may include fever, ear pain, fussiness, increased crying, and irritability. Many children will have temporary and minor hearing loss during and right after an ear infection. Permanent hearing loss is rare, but the risk increases the more infections a child has. Other causes of ear pain include retained water in the outer ear canal from swimming and bathing. Ear pain in adults is less likely to be from an ear infection. Ear pain may be referred from other locations. Referred pain may be from the joint between your jaw and the skull. It may also come from a tooth problem or problems in the neck. Other causes of ear pain include:  A foreign body in the ear.  Outer ear infection.  Sinus infections.  Impacted ear wax.  Ear injury.  Arthritis of the jaw or TMJ  problems.  Middle ear infection.  Tooth infections.  Sore throat with pain to the ears. DIAGNOSIS  Your caregiver can usually make the diagnosis by examining you. Sometimes other special studies, including x-rays and lab work may be necessary. TREATMENT   If antibiotics were prescribed, use them as directed and finish them even if you or your child's symptoms seem to be improved.  Sometimes PE tubes are needed in children. These are little plastic tubes which are put into the eardrum during a simple surgical procedure. They allow fluid to drain easier and allow the pressure in the middle ear to equalize. This helps relieve the ear pain caused by pressure changes. HOME CARE INSTRUCTIONS   Only take over-the-counter or prescription medicines for pain, discomfort, or fever as directed by your caregiver. DO NOT GIVE CHILDREN ASPIRIN because of the association of Reye's Syndrome in children taking aspirin.  Use a cold pack applied to the outer ear for 15-20 minutes, 03-04 times per day or as needed may reduce pain. Do not apply ice directly to the skin. You may cause frost bite.  Over-the-counter ear drops used as directed may be effective. Your caregiver may sometimes prescribe ear drops.  Resting in an upright position may help reduce pressure in the middle ear and relieve pain.  Ear pain caused by rapidly descending from high altitudes can be relieved by swallowing or chewing gum. Allowing infants to suck on a bottle during airplane travel can help.  Do not smoke in the house or  near children. If you are unable to quit smoking, smoke outside.  Control allergies. SEEK IMMEDIATE MEDICAL CARE IF:   You or your child are becoming sicker.  Pain or fever relief is not obtained with medicine.  You or your child's symptoms (pain, fever, or irritability) do not improve within 24 to 48 hours or as instructed.  Severe pain suddenly stops hurting. This may indicate a ruptured eardrum.  You  or your children develop new problems such as severe headaches, stiff neck, difficulty swallowing, or swelling of the face or around the ear. Document Released: 07/25/2004 Document Revised: 03/01/2012 Document Reviewed: 11/29/2008 [facility] Patient Information 2015 [place], [place]. This information is not intended to replace advice given to you by your health care provider. Make sure you discuss any questions you have with your health care provider.

## 2015-11-05 ENCOUNTER — Encounter (HOSPITAL_BASED_OUTPATIENT_CLINIC_OR_DEPARTMENT_OTHER): Payer: Self-pay | Admitting: Emergency Medicine

## 2015-11-05 DIAGNOSIS — Z3202 Encounter for pregnancy test, result negative: Secondary | ICD-10-CM | POA: Diagnosis not present

## 2015-11-05 DIAGNOSIS — Z202 Contact with and (suspected) exposure to infections with a predominantly sexual mode of transmission: Secondary | ICD-10-CM | POA: Insufficient documentation

## 2015-11-05 DIAGNOSIS — N760 Acute vaginitis: Secondary | ICD-10-CM | POA: Insufficient documentation

## 2015-11-05 DIAGNOSIS — Z79899 Other long term (current) drug therapy: Secondary | ICD-10-CM | POA: Diagnosis not present

## 2015-11-05 DIAGNOSIS — Z792 Long term (current) use of antibiotics: Secondary | ICD-10-CM | POA: Insufficient documentation

## 2015-11-05 DIAGNOSIS — Z8659 Personal history of other mental and behavioral disorders: Secondary | ICD-10-CM | POA: Insufficient documentation

## 2015-11-05 DIAGNOSIS — N898 Other specified noninflammatory disorders of vagina: Secondary | ICD-10-CM | POA: Diagnosis present

## 2015-11-05 LAB — URINALYSIS, ROUTINE W REFLEX MICROSCOPIC
BILIRUBIN URINE: NEGATIVE
Glucose, UA: NEGATIVE mg/dL
Hgb urine dipstick: NEGATIVE
KETONES UR: 15 mg/dL — AB
NITRITE: NEGATIVE
PROTEIN: NEGATIVE mg/dL
SPECIFIC GRAVITY, URINE: 1.03 (ref 1.005–1.030)
UROBILINOGEN UA: 1 mg/dL (ref 0.0–1.0)
pH: 5.5 (ref 5.0–8.0)

## 2015-11-05 LAB — URINE MICROSCOPIC-ADD ON

## 2015-11-05 LAB — PREGNANCY, URINE: PREG TEST UR: NEGATIVE

## 2015-11-05 NOTE — ED Notes (Signed)
Patient reports that she is having vaginal discharge and odor

## 2015-11-06 ENCOUNTER — Emergency Department (HOSPITAL_BASED_OUTPATIENT_CLINIC_OR_DEPARTMENT_OTHER)
Admission: EM | Admit: 2015-11-06 | Discharge: 2015-11-06 | Disposition: A | Discharge status: Home / Self Care | Attending: Emergency Medicine | Admitting: Emergency Medicine

## 2015-11-06 DIAGNOSIS — B9689 Other specified bacterial agents as the cause of diseases classified elsewhere: Secondary | ICD-10-CM

## 2015-11-06 DIAGNOSIS — Z711 Person with feared health complaint in whom no diagnosis is made: Secondary | ICD-10-CM

## 2015-11-06 DIAGNOSIS — N760 Acute vaginitis: Secondary | ICD-10-CM

## 2015-11-06 LAB — WET PREP, GENITAL
SPERM: NONE SEEN
TRICH WET PREP: NONE SEEN
Yeast Wet Prep HPF POC: NONE SEEN

## 2015-11-06 LAB — GC/CHLAMYDIA PROBE AMP (~~LOC~~) NOT AT ARMC
Chlamydia: NEGATIVE
Neisseria Gonorrhea: NEGATIVE

## 2015-11-06 MED ORDER — CEFTRIAXONE SODIUM 250 MG IJ SOLR
250.0000 mg | Freq: Once | INTRAMUSCULAR | Status: AC
  Administered 2015-11-06: 250 mg via INTRAMUSCULAR
  Filled 2015-11-06: qty 250

## 2015-11-06 MED ORDER — AZITHROMYCIN 250 MG PO TABS
1000.0000 mg | ORAL_TABLET | Freq: Once | ORAL | Status: AC
  Administered 2015-11-06: 1000 mg via ORAL
  Filled 2015-11-06: qty 4

## 2015-11-06 MED ORDER — METRONIDAZOLE 500 MG PO TABS: 500.0000 mg | ORAL_TABLET | Freq: Two times a day (BID) | ORAL | Status: DC

## 2015-11-06 NOTE — Discharge Instructions (Signed)
Bacterial Vaginosis °Bacterial vaginosis is a vaginal infection that occurs when the normal balance of bacteria in the vagina is disrupted. It results from an overgrowth of certain bacteria. This is the most common vaginal infection in women of childbearing age. Treatment is important to prevent complications, especially in pregnant women, as it can cause a premature delivery. °CAUSES  °Bacterial vaginosis is caused by an increase in harmful bacteria that are normally present in smaller amounts in the vagina. Several different kinds of bacteria can cause bacterial vaginosis. However, the reason that the condition develops is not fully understood. °RISK FACTORS °Certain activities or behaviors can put you at an increased risk of developing bacterial vaginosis, including: °· Having a new sex partner or multiple sex partners. °· Douching. °· Using an intrauterine device (IUD) for contraception. °Women do not get bacterial vaginosis from toilet seats, bedding, swimming pools, or contact with objects around them. °SIGNS AND SYMPTOMS  °Some women with bacterial vaginosis have no signs or symptoms. Common symptoms include: °· Grey vaginal discharge. °· A fishlike odor with discharge, especially after sexual intercourse. °· Itching or burning of the vagina and vulva. °· Burning or pain with urination. °DIAGNOSIS  °Your health care provider will take a medical history and examine the vagina for signs of bacterial vaginosis. A sample of vaginal fluid may be taken. Your health care provider will look at this sample under a microscope to check for bacteria and abnormal cells. A vaginal pH test may also be done.  °TREATMENT  °Bacterial vaginosis may be treated with antibiotic medicines. These may be given in the form of a pill or a vaginal cream. A second round of antibiotics may be prescribed if the condition comes back after treatment. Because bacterial vaginosis increases your risk for sexually transmitted diseases, getting  treated can help reduce your risk for chlamydia, gonorrhea, HIV, and herpes. °HOME CARE INSTRUCTIONS  °· Only take over-the-counter or prescription medicines as directed by your health care provider. °· If antibiotic medicine was prescribed, take it as directed. Make sure you finish it even if you start to feel better. °· Tell all sexual partners that you have a vaginal infection. They should see their health care provider and be treated if they have problems, such as a mild rash or itching. °· During treatment, it is important that you follow these instructions: °· Avoid sexual activity or use condoms correctly. °· Do not douche. °· Avoid alcohol as directed by your health care provider. °· Avoid breastfeeding as directed by your health care provider. °SEEK MEDICAL CARE IF:  °· Your symptoms are not improving after 3 days of treatment. °· You have increased discharge or pain. °· You have a fever. °MAKE SURE YOU:  °· Understand these instructions. °· Will watch your condition. °· Will get help right away if you are not doing well or get worse. °FOR MORE INFORMATION  °Centers for Disease Control and Prevention, Division of STD Prevention: www.cdc.gov/std °American Sexual Health Association (ASHA): www.ashastd.org  °  °This information is not intended to replace advice given to you by your health care provider. Make sure you discuss any questions you have with your health care provider. °  °Document Released: 12/08/2005 Document Revised: 12/29/2014 Document Reviewed: 07/20/2013 °Elsevier Interactive Patient Education ©2016 [employer] ° °Sexually Transmitted Disease °A sexually transmitted disease (STD) is a disease or infection that may be passed (transmitted) from person to person, usually during sexual activity. This may happen by way of saliva, semen, blood, vaginal   mucus, or urine. Common STDs include: °· Gonorrhea. °· Chlamydia. °· Syphilis. °· HIV and AIDS. °· Genital herpes. °· Hepatitis B and  C. °· Trichomonas. °· Human papillomavirus (HPV). °· Pubic lice. °· Scabies. °· Mites. °· Bacterial vaginosis. °WHAT ARE CAUSES OF STDs? °An STD may be caused by bacteria, a virus, or parasites. STDs are often transmitted during sexual activity if one person is infected. However, they may also be transmitted through nonsexual means. STDs may be transmitted after:  °· Sexual intercourse with an infected person. °· Sharing sex toys with an infected person. °· Sharing needles with an infected person or using unclean piercing or tattoo needles. °· Having intimate contact with the genitals, mouth, or rectal areas of an infected person. °· Exposure to infected fluids during birth. °WHAT ARE THE SIGNS AND SYMPTOMS OF STDs? °Different STDs have different symptoms. Some people may not have any symptoms. If symptoms are present, they may include: °· Painful or bloody urination. °· Pain in the pelvis, abdomen, vagina, anus, throat, or eyes. °· A skin rash, itching, or irritation. °· Growths, ulcerations, blisters, or sores in the genital and anal areas. °· Abnormal vaginal discharge with or without bad odor. °· Penile discharge in men. °· Fever. °· Pain or bleeding during sexual intercourse. °· Swollen glands in the groin area. °· Yellow skin and eyes (jaundice). This is seen with hepatitis. °· Swollen testicles. °· Infertility. °· Sores and blisters in the mouth. °HOW ARE STDs DIAGNOSED? °To make a diagnosis, your health care provider may: °· Take a medical history. °· Perform a physical exam. °· Take a sample of any discharge to examine. °· Swab the throat, cervix, opening to the penis, rectum, or vagina for testing. °· Test a sample of your first morning urine. °· Perform blood tests. °· Perform a Pap test, if this applies. °· Perform a colposcopy. °· Perform a laparoscopy. °HOW ARE STDs TREATED? °Treatment depends on the STD. Some STDs may be treated but not cured. °· Chlamydia, gonorrhea, trichomonas, and syphilis can be  cured with antibiotic medicine. °· Genital herpes, hepatitis, and HIV can be treated, but not cured, with prescribed medicines. The medicines lessen symptoms. °· Genital warts from HPV can be treated with medicine or by freezing, burning (electrocautery), or surgery. Warts may come back. °· HPV cannot be cured with medicine or surgery. However, abnormal areas may be removed from the cervix, vagina, or vulva. °· If your diagnosis is confirmed, your recent sexual partners need treatment. This is true even if they are symptom-free or have a negative culture or evaluation. They should not have sex until their health care providers say it is okay. °· Your health care provider may test you for infection again 3 months after treatment. °HOW CAN I REDUCE MY RISK OF GETTING AN STD? °Take these steps to reduce your risk of getting an STD: °· Use latex condoms, dental dams, and water-soluble lubricants during sexual activity. Do not use petroleum jelly or oils. °· Avoid having multiple sex partners. °· Do not have sex with someone who has other sex partners °· Do not have sex with anyone you do not know or who is at high risk for an STD. °· Avoid risky sex practices that can break your skin. °· Do not have sex if you have open sores on your mouth or skin. °· Avoid drinking too much alcohol or taking illegal drugs. Alcohol and drugs can affect your judgment and put you in a vulnerable position. °·   Avoid engaging in oral and anal sex acts. °· Get vaccinated for HPV and hepatitis. If you have not received these vaccines in the past, talk to your health care provider about whether one or both might be right for you. °· If you are at risk of being infected with HIV, it is recommended that you take a prescription medicine daily to prevent HIV infection. This is called pre-exposure prophylaxis (PrEP). You are considered at risk if: °¨ You are a man who has sex with other men (MSM). °¨ You are a heterosexual man or woman and are  sexually active with more than one partner. °¨ You take drugs by injection. °¨ You are sexually active with a partner who has HIV. °· Talk with your health care provider about whether you are at high risk of being infected with HIV. If you choose to begin PrEP, you should first be tested for HIV. You should then be tested every 3 months for as long as you are taking PrEP. °WHAT SHOULD I DO IF I THINK I HAVE AN STD? °· See your health care provider. °· Tell your sexual partner(s). They should be tested and treated for any STDs. °· Do not have sex until your health care provider says it is okay. °WHEN SHOULD I GET IMMEDIATE MEDICAL CARE? °Contact your health care provider right away if:  °· You have severe abdominal pain. °· You are a man and notice swelling or pain in your testicles. °· You are a woman and notice swelling or pain in your vagina. °  °This information is not intended to replace advice given to you by your health care provider. Make sure you discuss any questions you have with your health care provider. °  °Document Released: 02/28/2003 Document Revised: 12/29/2014 Document Reviewed: 06/28/2013 °Elsevier Interactive Patient Education ©2016 Elsevier Inc. ° °

## 2015-11-06 NOTE — ED Provider Notes (Signed)
CSN: [id number]     Arrival date & time 11/05/15  2322 History   First MD Initiated Contact with Patient 11/06/15 0105     Chief Complaint  Patient presents with  . Vaginal Discharge   Deborah Rodriguez is a 29 y.o. female who is otherwise healthy presents to the emergency department complaining of white vaginal discharge and odor for the past 3 days. Patient reports some intermittent lower abdominal cramping but none currently. She reports she is sexually active and does not use protection. She reports she is in a monogamous relationship with 1 partner. She has taken nothing for treatment today. The patient denies fevers, chills, nausea, vomiting, diarrhea, urinary symptoms, hematuria, vaginal bleeding, or rashes.   (Consider location/radiation/quality/duration/timing/severity/associated sxs/prior Treatment) HPI  Past Medical History  Diagnosis Date  . Preeclampsia   . Anxiety    Past Surgical History  Procedure Laterality Date  . Cesarean section    . Tonsillectomy     Family History  Problem Relation Age of Onset  . Diabetes Maternal Grandfather   . Stroke Paternal Grandfather    Social History  Substance Use Topics  . Smoking status: Never Smoker   . Smokeless tobacco: Never Used  . Alcohol Use: Yes   OB History    No data available     Review of Systems  Constitutional: Negative for fever and chills.  HENT: Negative for mouth sores.   Respiratory: Negative for cough.   Cardiovascular: Negative for chest pain.  Gastrointestinal: Positive for abdominal pain (intermittent). Negative for nausea, vomiting, diarrhea and blood in stool.  Genitourinary: Positive for vaginal discharge. Negative for dysuria, urgency, frequency, hematuria, flank pain, decreased urine volume, vaginal bleeding, difficulty urinating, genital sores, vaginal pain and pelvic pain.  Musculoskeletal: Negative for back pain.  Skin: Negative for rash.  Neurological: Negative for light-headedness and  headaches.      Allergies  Review of patient's allergies indicates no known allergies.  Home Medications   Prior to Admission medications   Medication Sig Start Date End Date Taking? Authorizing Provider  amoxicillin (AMOXIL) 500 MG capsule Take 1 capsule (500 mg total) by mouth 2 (two) times daily. 09/17/15   Courteney Lyn Mackuen, MD  amoxicillin-clavulanate (AUGMENTIN) 875-125 MG tablet Take 1 tablet by mouth every 12 (twelve) hours. 09/25/15   Joycie Peek, PA-C  Cholecalciferol (VITAMIN D PO) Take by mouth.    Historical Provider, MD  ciprofloxacin (CILOXAN) 0.3 % ophthalmic ointment Place into the left eye 2 (two) times daily. x7 days 06/14/15   Kathrynn Speed, PA-C  metroNIDAZOLE (FLAGYL) 500 MG tablet Take 1 tablet (500 mg total) by mouth 2 (two) times daily. 11/06/15   Everlene Farrier, PA-C  Multiple Vitamin (MULTIVITAMIN) tablet Take 1 tablet by mouth daily.    Historical Provider, MD   BP 120/80 mmHg  Pulse 80  Temp(Src) 98.4 F (36.9 C) (Oral)  Resp 18  Ht  (1.702 m)  Wt 196 lb (88.905 kg)  BMI 30.69 kg/m2  SpO2 98%  LMP 10/19/2015 Physical Exam  Constitutional: She appears well-developed and well-nourished. No distress.  Nontoxic appearing.  HENT:  Head: Normocephalic and atraumatic.  Eyes: Conjunctivae are normal. Pupils are equal, round, and reactive to light. Right eye exhibits no discharge. Left eye exhibits no discharge.  Neck: Neck supple.  Cardiovascular: Normal rate, regular rhythm, normal heart sounds and intact distal pulses.   Pulmonary/Chest: Effort normal and breath sounds normal. No respiratory distress. She has no wheezes. She has no rales.  Abdominal: Soft. Bowel sounds are normal. She exhibits no distension and no mass. There is no tenderness. There is no rebound and no guarding.  Abdomen is soft and nontender to palpation. Bowel sounds are present. No peritoneal signs.  Genitourinary: Vaginal discharge found.  Pelvic exam performed by me with  female RN as chaperone. No external lesions or rashes noted. No vaginal bleeding. Mild white vaginal discharge noted. Cervix is closed. No cervical motion tenderness. No adnexal tenderness or fullness.  Lymphadenopathy:    She has no cervical adenopathy.  Neurological: She is alert. Coordination normal.  Skin: Skin is warm and dry. No rash noted. She is not diaphoretic. No erythema. No pallor.  Psychiatric: She has a normal mood and affect. Her behavior is normal.  Nursing note and vitals reviewed.   ED Course  Procedures (including critical care time) Labs Review Labs Reviewed  URINALYSIS, ROUTINE W REFLEX MICROSCOPIC (NOT AT [facility]) - Abnormal; Notable for the following:    APPearance CLOUDY (*)    Ketones, ur 15 (*)    Leukocytes, UA SMALL (*)    All other components within normal limits  URINE MICROSCOPIC-ADD ON - Abnormal; Notable for the following:    Squamous Epithelial / LPF MANY (*)    Bacteria, UA FEW (*)    All other components within normal limits  WET PREP, GENITAL  URINE CULTURE  PREGNANCY, URINE  HIV ANTIBODY (ROUTINE TESTING)  RPR  GC/CHLAMYDIA PROBE AMP ([place]) NOT AT [facility]    Imaging Review No results found. I have personally reviewed and evaluated these lab results as part of my medical decision-making.   EKG Interpretation None      Filed Vitals:   11/05/15 2327  BP: 120/80  Pulse: 80  Temp: 98.4 F (36.9 C)  TempSrc: Oral  Resp: 18  Height: 5\' 7"  (1.702 m)  Weight: 196 lb (88.905 kg)  SpO2: 98%     MDM   Meds given in ED:  Medications  azithromycin (ZITHROMAX) tablet 1,000 mg (not administered)  cefTRIAXone (ROCEPHIN) injection 250 mg (not administered)    New Prescriptions   METRONIDAZOLE (FLAGYL) 500 MG TABLET    Take 1 tablet (500 mg total) by mouth 2 (two) times daily.    Final diagnoses:  Concern about STD in female without diagnosis  BV (bacterial vaginosis)   This is a 29 y.o. female who is otherwise healthy presents to  the emergency department complaining of white vaginal discharge and odor for the past 3 days. Patient reports some intermittent lower abdominal cramping but none currently. She reports she is sexually active and does not use protection. She denies urinary symptoms. On exam the patient is afebrile and non-toxic appearing. Her abdomen is soft and non-tender to palpation. On pelvic exam she has a mild amount of white vaginal discharge. No cervical motion tenderness. No adnexal tenderness. No vaginal bleeding.  Urine shows small leukocytes and is nitrite negative. Urine microscopic shows many squamous epithelial cells and few bacteria. Urine pregnancy test is negative.  Wet prep shows clue cells and many white blood cells. Will provide with azithromycin and Rocephin and sent home with prescription for Flagyl. Urine sent for culture. I encouraged close follow-up with her OB/GYN. I provided education on STD testing and treatment. I advised that she has pending HIV, syphilis, gonorrhea and Chlamydia testing and to follow-up with these test results. I advised the patient to follow-up with their primary care provider this week. I advised the patient to return to  the emergency department with new or worsening symptoms or new concerns. The patient verbalized understanding and agreement with plan.        Everlene Farrier, PA-C 11/06/15 0150  Loren Racer, MD 11/06/15 [phone]

## 2015-11-07 LAB — URINE CULTURE

## 2015-11-07 LAB — RPR: RPR Ser Ql: NONREACTIVE

## 2015-11-07 LAB — HIV ANTIBODY (ROUTINE TESTING W REFLEX): HIV SCREEN 4TH GENERATION: NONREACTIVE

## 2016-01-29 ENCOUNTER — Encounter (HOSPITAL_BASED_OUTPATIENT_CLINIC_OR_DEPARTMENT_OTHER): Payer: Self-pay

## 2016-01-29 DIAGNOSIS — G43109 Migraine with aura, not intractable, without status migrainosus: Secondary | ICD-10-CM | POA: Diagnosis not present

## 2016-01-29 DIAGNOSIS — Z8659 Personal history of other mental and behavioral disorders: Secondary | ICD-10-CM | POA: Insufficient documentation

## 2016-01-29 DIAGNOSIS — R51 Headache: Secondary | ICD-10-CM | POA: Diagnosis present

## 2016-01-29 NOTE — ED Notes (Signed)
C/o "sinus pain" x 3 days-NAD

## 2016-01-30 ENCOUNTER — Emergency Department (HOSPITAL_BASED_OUTPATIENT_CLINIC_OR_DEPARTMENT_OTHER)
Admission: EM | Admit: 2016-01-30 | Discharge: 2016-01-30 | Disposition: A | Discharge status: Home / Self Care | Attending: Emergency Medicine | Admitting: Emergency Medicine

## 2016-01-30 DIAGNOSIS — G43109 Migraine with aura, not intractable, without status migrainosus: Secondary | ICD-10-CM

## 2016-01-30 MED ORDER — SODIUM CHLORIDE 0.9 % IV SOLN
1000.0000 mL | Freq: Once | INTRAVENOUS | Status: AC
  Administered 2016-01-30: 1000 mL via INTRAVENOUS

## 2016-01-30 MED ORDER — METOCLOPRAMIDE HCL 10 MG PO TABS: 10.0000 mg | ORAL_TABLET | Freq: Four times a day (QID) | ORAL | Status: DC | PRN

## 2016-01-30 MED ORDER — METOCLOPRAMIDE HCL 5 MG/ML IJ SOLN
10.0000 mg | Freq: Once | INTRAMUSCULAR | Status: AC
  Administered 2016-01-30: 10 mg via INTRAVENOUS
  Filled 2016-01-30: qty 2

## 2016-01-30 MED ORDER — DIPHENHYDRAMINE HCL 50 MG/ML IJ SOLN
25.0000 mg | Freq: Once | INTRAMUSCULAR | Status: AC
  Administered 2016-01-30: 25 mg via INTRAVENOUS
  Filled 2016-01-30: qty 1

## 2016-01-30 MED ORDER — SODIUM CHLORIDE 0.9 % IV SOLN: 1000.0000 mL | INTRAVENOUS | Status: DC

## 2016-01-30 NOTE — ED Provider Notes (Signed)
CSN: [id number]     Arrival date & time 01/29/16  2254 History   First MD Initiated Contact with Patient 01/30/16 0355     Chief Complaint  Patient presents with  . Facial Pain     (Consider location/radiation/quality/duration/timing/severity/associated sxs/prior Treatment) The history is provided by the patient.   30 year old female comes in with three-day history of headache. Headache is bifrontal and described as a throbbing pain which she rates at 8/10. It is worse with exposure to light. There is no worsening with exposure to sound or odors. She had flashes of light before onset of the headache. She thought was a sinus headache because she has been having problems with her sinuses. She nursed been no fever and she's had no nasal congestion or rhinorrhea or cough. She denies weakness, numbness, tingling. She has not taken anything to relieve the pain.  Past Medical History  Diagnosis Date  . Preeclampsia   . Anxiety    Past Surgical History  Procedure Laterality Date  . Cesarean section    . Tonsillectomy     Family History  Problem Relation Age of Onset  . Diabetes Maternal Grandfather   . Stroke Paternal Grandfather    Social History  Substance Use Topics  . Smoking status: Never Smoker   . Smokeless tobacco: Never Used  . Alcohol Use: Yes     Comment: occ   OB History    No data available     Review of Systems  All other systems reviewed and are negative.     Allergies  Review of patient's allergies indicates no known allergies.  Home Medications   Prior to Admission medications   Not on File   BP 103/64 mmHg  Pulse 80  Temp(Src) 98.6 F (37 C) (Oral)  Resp 18  Ht  (1.702 m)  Wt 205 lb (92.987 kg)  BMI 32.10 kg/m2  SpO2 98%  LMP 01/27/2016 Physical Exam  Nursing note and vitals reviewed.  30 year old female, resting comfortably and in no acute distress. Vital signs are normal. Oxygen saturation is 98%, which is normal. Head is  normocephalic and atraumatic. PERRLA, EOMI. Oropharynx is clear. Neck is nontender and supple without adenopathy or JVD. there is no tenderness palpation over frontal sinuses. There is mild tenderness to palpation over maxillary sinuses. Turbinates are nonedematous and there is no nasal drainage. Oropharynx is clear without any postnasal drainage. Back is nontender and there is no CVA tenderness. Lungs are clear without rales, wheezes, or rhonchi. Chest is nontender. Heart has regular rate and rhythm without murmur. Abdomen is soft, flat, nontender without masses or hepatosplenomegaly and peristalsis is normoactive. Extremities have no cyanosis or edema, full range of motion is present. Skin is warm and dry without rash. Neurologic: Mental status is normal, cranial nerves are intact, there are no motor or sensory deficits.  ED Course  Procedures (including critical care time)  MDM   Final diagnoses:  Migraine with aura and without status migrainosus, not intractable   Headache with pattern that seems much more consistent with migraine headaches and sinus headache. Old records are reviewed and she has no relevant past visits. She will be given IV fluids, metoclopramide, diphenhydramine and reassessed.  She had excellent relief of her headache with above noted treatment and is discharged with prescription for metoclopramide.  Dione Booze, MD 01/30/16 [phone]

## 2016-01-30 NOTE — Discharge Instructions (Signed)
Migraine Headache A migraine headache is an intense, throbbing pain on one or both sides of your head. A migraine can last for 30 minutes to several hours. CAUSES  The exact cause of a migraine headache is not always known. However, a migraine may be caused when nerves in the brain become irritated and release chemicals that cause inflammation. This causes pain. Certain things may also trigger migraines, such as:  Alcohol.  Smoking.  Stress.  Menstruation.  Aged cheeses.  Foods or drinks that contain nitrates, glutamate, aspartame, or tyramine.  Lack of sleep.  Chocolate.  Caffeine.  Hunger.  Physical exertion.  Fatigue.  Medicines used to treat chest pain (nitroglycerine), birth control pills, estrogen, and some blood pressure medicines. SIGNS AND SYMPTOMS  Pain on one or both sides of your head.  Pulsating or throbbing pain.  Severe pain that prevents daily activities.  Pain that is aggravated by any physical activity.  Nausea, vomiting, or both.  Dizziness.  Pain with exposure to bright lights, loud noises, or activity.  General sensitivity to bright lights, loud noises, or smells. Before you get a migraine, you may get warning signs that a migraine is coming (aura). An aura may include:  Seeing flashing lights.  Seeing bright spots, halos, or zigzag lines.  Having tunnel vision or blurred vision.  Having feelings of numbness or tingling.  Having trouble talking.  Having muscle weakness. DIAGNOSIS  A migraine headache is often diagnosed based on:  Symptoms.  Physical exam.  A CT scan or MRI of your head. These imaging tests cannot diagnose migraines, but they can help rule out other causes of headaches. TREATMENT Medicines may be given for pain and nausea. Medicines can also be given to help prevent recurrent migraines.  HOME CARE INSTRUCTIONS  Only take over-the-counter or prescription medicines for pain or discomfort as directed by your  health care provider. The use of long-term narcotics is not recommended.  Lie down in a dark, quiet room when you have a migraine.  Keep a journal to find out what may trigger your migraine headaches. For example, write down:  What you eat and drink.  How much sleep you get.  Any change to your diet or medicines.  Limit alcohol consumption.  Quit smoking if you smoke.  Get 7-9 hours of sleep, or as recommended by your health care provider.  Limit stress.  Keep lights dim if bright lights bother you and make your migraines worse. SEEK IMMEDIATE MEDICAL CARE IF:   Your migraine becomes severe.  You have a fever.  You have a stiff neck.  You have vision loss.  You have muscular weakness or loss of muscle control.  You start losing your balance or have trouble walking.  You feel faint or pass out.  You have severe symptoms that are different from your first symptoms. MAKE SURE YOU:   Understand these instructions.  Will watch your condition.  Will get help right away if you are not doing well or get worse.   This information is not intended to replace advice given to you by your health care provider. Make sure you discuss any questions you have with your health care provider.   Document Released: 12/08/2005 Document Revised: 12/29/2014 Document Reviewed: 08/15/2013 Elsevier Interactive Patient Education 2016 Else[employer]  Metoclopramide tablets What is this medicine? METOCLOPRAMIDE (met oh kloe PRA mide) is used to treat the symptoms of gastroesophageal reflux disease (GERD) like heartburn. It is also used to treat people with slow  emptying of the stomach and intestinal tract. This medicine may be used for other purposes; ask your health care provider or pharmacist if you have questions. What should I tell my health care provider before I take this medicine? They need to know if you have any of these conditions: -breast cancer -depression -diabetes -heart  failure -high blood pressure -kidney disease -liver disease -Parkinson's disease or a movement disorder -pheochromocytoma -seizures -stomach obstruction, bleeding, or perforation -an unusual or allergic reaction to metoclopramide, procainamide, sulfites, other medicines, foods, dyes, or preservatives -pregnant or trying to get pregnant -breast-feeding How should I use this medicine? Take this medicine by mouth with a glass of water. Follow the directions on the prescription label. Take this medicine on an empty stomach, about 30 minutes before eating. Take your doses at regular intervals. Do not take your medicine more often than directed. Do not stop taking except on the advice of your doctor or health care professional. A special MedGuide will be given to you by the pharmacist with each prescription and refill. Be sure to read this information carefully each time. Talk to your pediatrician regarding the use of this medicine in children. Special care may be needed. Overdosage: If you think you have taken too much of this medicine contact a poison control center or emergency room at once. NOTE: This medicine is only for you. Do not share this medicine with others. What if I miss a dose? If you miss a dose, take it as soon as you can. If it is almost time for your next dose, take only that dose. Do not take double or extra doses. What may interact with this medicine? -acetaminophen -cyclosporine -digoxin -medicines for blood pressure -medicines for diabetes, including insulin -medicines for hay fever and other allergies -medicines for depression, especially an Monoamine Oxidase Inhibitor (MAOI) -medicines for Parkinson's disease, like levodopa -medicines for sleep or for pain -tetracycline This list may not describe all possible interactions. Give your health care provider a list of all the medicines, herbs, non-prescription drugs, or dietary supplements you use. Also tell them if you  smoke, drink alcohol, or use illegal drugs. Some items may interact with your medicine. What should I watch for while using this medicine? It may take a few weeks for your stomach condition to start to get better. However, do not take this medicine for longer than 12 weeks. The longer you take this medicine, and the more you take it, the greater your chances are of developing serious side effects. If you are an elderly patient, a female patient, or you have diabetes, you may be at an increased risk for side effects from this medicine. Contact your doctor immediately if you start having movements you cannot control such as lip smacking, rapid movements of the tongue, involuntary or uncontrollable movements of the eyes, head, arms and legs, or muscle twitches and spasms. Patients and their families should watch out for worsening depression or thoughts of suicide. Also watch out for any sudden or severe changes in feelings such as feeling anxious, agitated, panicky, irritable, hostile, aggressive, impulsive, severely restless, overly excited and hyperactive, or not being able to sleep. If this happens, especially at the beginning of treatment or after a change in dose, call your doctor. Do not treat yourself for high fever. Ask your doctor or health care professional for advice. You may get drowsy or dizzy. Do not drive, use machinery, or do anything that needs mental alertness until you know how this drug affects  you. Do not stand or sit up quickly, especially if you are an older patient. This reduces the risk of dizzy or fainting spells. Alcohol can make you more drowsy and dizzy. Avoid alcoholic drinks. What side effects may I notice from receiving this medicine? Side effects that you should report to your doctor or health care professional as soon as possible: -allergic reactions like skin rash, itching or hives, swelling of the face, lips, or tongue -abnormal production of milk in females -breast  enlargement in both males and females -change in the way you walk -difficulty moving, speaking or swallowing -drooling, lip smacking, or rapid movements of the tongue -excessive sweating -fever -involuntary or uncontrollable movements of the eyes, head, arms and legs -irregular heartbeat or palpitations -muscle twitches and spasms -unusually weak or tired Side effects that usually do not require medical attention (report to your doctor or health care professional if they continue or are bothersome): -change in sex drive or performance -depressed mood -diarrhea -difficulty sleeping -headache -menstrual changes -restless or nervous This list may not describe all possible side effects. Call your doctor for medical advice about side effects. You may report side effects to FDA at 1-800-FDA-1088. Where should I keep my medicine? Keep out of the reach of children. Store at room temperature between 20 and 25 degrees C (68 and 77 degrees F). Protect from light. Keep container tightly closed. Throw away any unused medicine after the expiration date. NOTE: This sheet is a summary. It may not cover all possible information. If you have questions about this medicine, talk to your doctor, pharmacist, or health care provider.    2016, Elsevier/Gold Standard. (2012-04-06 13:04:38)

## 2016-02-08 ENCOUNTER — Inpatient Hospital Stay (HOSPITAL_COMMUNITY)
Admission: AD | Admit: 2016-02-08 | Discharge: 2016-02-08 | Disposition: A | Discharge status: Home / Self Care | Source: Ambulatory Visit | Attending: Obstetrics & Gynecology | Admitting: Obstetrics & Gynecology

## 2016-02-08 ENCOUNTER — Encounter (HOSPITAL_COMMUNITY): Payer: Self-pay | Admitting: *Deleted

## 2016-02-08 DIAGNOSIS — N760 Acute vaginitis: Secondary | ICD-10-CM | POA: Insufficient documentation

## 2016-02-08 DIAGNOSIS — B9689 Other specified bacterial agents as the cause of diseases classified elsewhere: Secondary | ICD-10-CM | POA: Insufficient documentation

## 2016-02-08 DIAGNOSIS — A499 Bacterial infection, unspecified: Secondary | ICD-10-CM | POA: Diagnosis not present

## 2016-02-08 DIAGNOSIS — N898 Other specified noninflammatory disorders of vagina: Secondary | ICD-10-CM | POA: Diagnosis present

## 2016-02-08 LAB — WET PREP, GENITAL
Sperm: NONE SEEN
Trich, Wet Prep: NONE SEEN
YEAST WET PREP: NONE SEEN

## 2016-02-08 LAB — URINALYSIS, ROUTINE W REFLEX MICROSCOPIC
BILIRUBIN URINE: NEGATIVE
Glucose, UA: NEGATIVE mg/dL
Hgb urine dipstick: NEGATIVE
KETONES UR: NEGATIVE mg/dL
LEUKOCYTES UA: NEGATIVE
NITRITE: NEGATIVE
PROTEIN: NEGATIVE mg/dL
Specific Gravity, Urine: 1.025 (ref 1.005–1.030)
pH: 5.5 (ref 5.0–8.0)

## 2016-02-08 LAB — POCT PREGNANCY, URINE: PREG TEST UR: NEGATIVE

## 2016-02-08 MED ORDER — METRONIDAZOLE 500 MG PO TABS: 500.0000 mg | ORAL_TABLET | Freq: Two times a day (BID) | ORAL | Status: DC

## 2016-02-08 NOTE — Discharge Instructions (Signed)

## 2016-02-08 NOTE — MAU Note (Signed)
Pt states she is having vaginal discharge with an odor that started about 2 days ago.  Pt states she tried vaginal probiotics and she thinks that made it worse.

## 2016-02-08 NOTE — MAU Provider Note (Signed)
History     CSN: [id number]  Arrival date and time: 02/08/16 0941   First Provider Initiated Contact with Patient 02/08/16 1005      Chief Complaint  Patient presents with  . Vaginal Discharge   HPI [name] is a 30 y.o. [id number] who presents to MAU today with complaint of vaginal odor x 2 days. She has tried a vaginal probiotic, but feels it may have worsened the symptoms. She is sexually active and does not use condoms. She is on OCPs for birth control. She has noted a small amount of colorless discharge as well. She denies vaginal bleeding, abdominal pain, fever or UTI symptoms.   OB History    Gravida Para Term Preterm AB TAB SAB Ectopic Multiple Living   Past Medical History  Diagnosis Date  . Anxiety   . Preeclampsia     Past Surgical History  Procedure Laterality Date  . Cesarean section    . Tonsillectomy      Family History  Problem Relation Age of Onset  . Diabetes Maternal Grandfather   . Stroke Paternal Grandfather     Social History  Substance Use Topics  . Smoking status: Never Smoker   . Smokeless tobacco: Never Used  . Alcohol Use: Yes     Comment: occ    Allergies: No Known Allergies  Prescriptions prior to admission  Medication Sig Dispense Refill Last Dose  . fluticasone (FLONASE) 50 MCG/ACT nasal spray Place 1 spray into both nostrils daily as needed for allergies or rhinitis.   Past Week at Unknown time  . norethindrone-ethinyl estradiol (JUNEL FE,GILDESS FE,LOESTRIN FE) 1-20 MG-MCG tablet Take 1 tablet by mouth at bedtime.   02/07/2016 at Unknown time  . metoCLOPramide (REGLAN) 10 MG tablet Take 1 tablet (10 mg total) by mouth every 6 (six) hours as needed for nausea (or headache). (Patient not taking: Reported on 02/08/2016) 30 tablet 0 Not Taking at Unknown time    Review of Systems  Constitutional: Negative for fever and malaise/fatigue.  Gastrointestinal: Negative for nausea, vomiting, abdominal pain,  diarrhea and constipation.  Genitourinary: Negative for dysuria, urgency and frequency.       + vaginal discharge, odor Neg - vaginal bleeding   Physical Exam   Blood pressure 131/73, pulse 83, temperature 98.3 F (36.8 C), temperature source Oral, resp. rate 18, last menstrual period 01/26/2016.  Physical Exam  Nursing note and vitals reviewed. Constitutional: She is oriented to person, place, and time. She appears well-developed and well-nourished. No distress.  HENT:  Head: Normocephalic and atraumatic.  Cardiovascular: Normal rate.   Respiratory: Effort normal.  GI: Soft. She exhibits no distension and no mass. There is no tenderness. There is no rebound and no guarding.  Genitourinary: Uterus is not enlarged and not tender. Cervix exhibits no motion tenderness, no discharge and no friability. Right adnexum displays no mass and no tenderness. Left adnexum displays no mass and no tenderness. No bleeding in the vagina. Vaginal discharge (small amount of thin, white discharge noted) found.  Neurological: She is alert and oriented to person, place, and time.  Skin: Skin is warm and dry. No erythema.  Psychiatric: She has a normal mood and affect.    Results for orders placed or performed during the hospital encounter of 02/08/16 (from the past 24 hour(s))  Urinalysis, Routine w reflex microscopic (not at [facility])     Status: None  Collection Time: 02/08/16  9:49 AM  Result Value Ref Range   Color, Urine YELLOW YELLOW   APPearance CLEAR CLEAR   Specific Gravity, Urine 1.025 1.005 - 1.030   pH 5.5 5.0 - 8.0   Glucose, UA NEGATIVE NEGATIVE mg/dL   Hgb urine dipstick NEGATIVE NEGATIVE   Bilirubin Urine NEGATIVE NEGATIVE   Ketones, ur NEGATIVE NEGATIVE mg/dL   Protein, ur NEGATIVE NEGATIVE mg/dL   Nitrite NEGATIVE NEGATIVE   Leukocytes, UA NEGATIVE NEGATIVE  Pregnancy, urine POC     Status: None   Collection Time: 02/08/16  9:50 AM  Result Value Ref Range   Preg Test, Ur NEGATIVE  NEGATIVE  Wet prep, genital     Status: Abnormal   Collection Time: 02/08/16 10:15 AM  Result Value Ref Range   Yeast Wet Prep HPF POC NONE SEEN NONE SEEN   Trich, Wet Prep NONE SEEN NONE SEEN   Clue Cells Wet Prep HPF POC PRESENT (A) NONE SEEN   WBC, Wet Prep HPF POC MODERATE (A) NONE SEEN   Sperm NONE SEEN     MAU Course  Procedures None  MDM UPT - negative UA, wet prep, GC/Chlamydia, HIV and RPR  Assessment and Plan  A: Bacterial Vaginosis  P: Discharge home Rx for Flagyl given to patient Warning signs for worsening condition discussed Patient advised to follow-up with OB/Gyn in [place] as needed or if symptoms persist or worsen Patient may return to MAU as needed or if her condition were to change or worsen  Marny Lowenstein, PA-C  02/08/2016, 10:50 AM

## 2016-02-09 LAB — HIV ANTIBODY (ROUTINE TESTING W REFLEX): HIV SCREEN 4TH GENERATION: NONREACTIVE

## 2016-02-09 LAB — RPR: RPR: NONREACTIVE

## 2016-02-11 LAB — GC/CHLAMYDIA PROBE AMP (~~LOC~~) NOT AT ARMC
CHLAMYDIA, DNA PROBE: NEGATIVE
Neisseria Gonorrhea: NEGATIVE

## 2016-04-03 ENCOUNTER — Emergency Department (HOSPITAL_BASED_OUTPATIENT_CLINIC_OR_DEPARTMENT_OTHER)
Admission: EM | Admit: 2016-04-03 | Discharge: 2016-04-04 | Disposition: A | Discharge status: Home / Self Care | Attending: Emergency Medicine | Admitting: Emergency Medicine

## 2016-04-03 ENCOUNTER — Encounter (HOSPITAL_BASED_OUTPATIENT_CLINIC_OR_DEPARTMENT_OTHER): Payer: Self-pay

## 2016-04-03 DIAGNOSIS — N898 Other specified noninflammatory disorders of vagina: Secondary | ICD-10-CM | POA: Diagnosis present

## 2016-04-03 DIAGNOSIS — N760 Acute vaginitis: Secondary | ICD-10-CM | POA: Diagnosis not present

## 2016-04-03 DIAGNOSIS — B9689 Other specified bacterial agents as the cause of diseases classified elsewhere: Secondary | ICD-10-CM

## 2016-04-03 LAB — URINALYSIS, ROUTINE W REFLEX MICROSCOPIC
GLUCOSE, UA: NEGATIVE mg/dL
HGB URINE DIPSTICK: NEGATIVE
Ketones, ur: 15 mg/dL — AB
Nitrite: NEGATIVE
Protein, ur: NEGATIVE mg/dL
SPECIFIC GRAVITY, URINE: 1.028 (ref 1.005–1.030)
pH: 5.5 (ref 5.0–8.0)

## 2016-04-03 LAB — URINE MICROSCOPIC-ADD ON

## 2016-04-03 LAB — PREGNANCY, URINE: Preg Test, Ur: NEGATIVE

## 2016-04-03 NOTE — ED Provider Notes (Signed)
CSN: [id number]     Arrival date & time 04/03/16  2139 History   First MD Initiated Contact with Patient 04/03/16 2301     Chief Complaint  Patient presents with  . Vaginal Discharge     (Consider location/radiation/quality/duration/timing/severity/associated sxs/prior Treatment) HPI Comments: Patient presents today with a chief complaint of vaginal discharge.  She states that she has had odorous whitish colored discharge for the past week.  She reports that she is sexually active and does not use protection.  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  She denies fever, chills, nausea, vomiting, pelvic pain, abdominal pain, urinary symptoms, or any other symptoms.  The history is provided by the patient.    Past Medical History  Diagnosis Date  . Anxiety   . Preeclampsia    Past Surgical History  Procedure Laterality Date  . Cesarean section    . Tonsillectomy     Family History  Problem Relation Age of Onset  . Diabetes Maternal Grandfather   . Stroke Paternal Grandfather    Social History  Substance Use Topics  . Smoking status: Never Smoker   . Smokeless tobacco: Never Used  . Alcohol Use: Yes     Comment: occ   OB History    Gravida Para Term Preterm AB TAB SAB Ectopic Multiple Living   Review of Systems  All other systems reviewed and are negative.     Allergies  Review of patient's allergies indicates no known allergies.  Home Medications   Prior to Admission medications   Medication Sig Start Date End Date Taking? Authorizing Provider  fluticasone (FLONASE) 50 MCG/ACT nasal spray Place 1 spray into both nostrils daily as needed for allergies or rhinitis.    Historical Provider, MD  metroNIDAZOLE (FLAGYL) 500 MG tablet Take 1 tablet (500 mg total) by mouth 2 (two) times daily. 02/08/16   Marny Lowenstein, PA-C  norethindrone-ethinyl estradiol (JUNEL FE,GILDESS FE,LOESTRIN FE) 1-20 MG-MCG tablet Take 1  tablet by mouth at bedtime.    Historical Provider, MD   BP 126/77 mmHg  Pulse 83  Temp(Src) 98.5 F (36.9 C)  Resp 18  Ht  (1.702 m)  Wt 90.719 kg  BMI 31.32 kg/m2  SpO2 100%  LMP 03/15/2016 Physical Exam  Constitutional: She appears well-developed and well-nourished.  HENT:  Head: Normocephalic and atraumatic.  Mouth/Throat: Oropharynx is clear and moist.  Neck: Normal range of motion. Neck supple.  Cardiovascular: Normal rate, regular rhythm and normal heart sounds.   Pulmonary/Chest: Effort normal and breath sounds normal.  Abdominal: Soft. Bowel sounds are normal. She exhibits no distension and no mass. There is no tenderness. There is no rebound and no guarding.  Genitourinary: Cervix exhibits no motion tenderness. Right adnexum displays no mass, no tenderness and no fullness. Left adnexum displays no mass, no tenderness and no fullness.  Small amount of whitish discharge in the vaginal vault  Musculoskeletal: Normal range of motion.  Neurological: She is alert.  Skin: Skin is warm and dry.  Psychiatric: She has a normal mood and affect.  Nursing note and vitals reviewed.   ED Course  Procedures (including critical care time) Labs Review Labs Reviewed  WET PREP, GENITAL - Abnormal; Notable for the following:    Clue Cells Wet Prep HPF POC PRESENT (*)    WBC, Wet Prep HPF POC MODERATE (*)    All  other components within normal limits  URINALYSIS, ROUTINE W REFLEX MICROSCOPIC ([facility]) - Abnormal; Notable for the following:    APPearance CLOUDY (*)    Bilirubin Urine SMALL (*)    Ketones, ur 15 (*)    Leukocytes, UA TRACE (*)    All other components within normal limits  URINE MICROSCOPIC-ADD ON - Abnormal; Notable for the following:    Squamous Epithelial / LPF 0-5 (*)    Bacteria, UA MANY (*)    All other components within normal limits  PREGNANCY, URINE  RPR  HIV ANTIBODY (ROUTINE TESTING)  GC/CHLAMYDIA PROBE AMP ([place]) NOT AT [facility]     Imaging Review No results found. I have personally reviewed and evaluated these images and lab results as part of my medical decision-making.   EKG Interpretation None      MDM   Final diagnoses:  None   Patient presents today with vaginal discharge x 1 week.  Urine pregnancy negative.  Wet prep showing Clue Cells and moderate WBC consistent with BV.  GC/Chlamydia pending.  HIV and Syphilis also pending.  Patient given Rx for Flagyl.  Stable for discharge.  Return precautions given.    Santiago GladHeather Alika Saladin, PA-C 04/04/16 [phone]  Paula LibraJohn Molpus, MD 04/04/16 [phone]

## 2016-04-03 NOTE — ED Notes (Signed)
Pt c/o vag dc w odor x 1 week,  Denies any pain

## 2016-04-03 NOTE — ED Notes (Signed)
Pt c/o white, foul smelling vaginal discharge for a week

## 2016-04-04 LAB — WET PREP, GENITAL
Sperm: NONE SEEN
TRICH WET PREP: NONE SEEN
YEAST WET PREP: NONE SEEN

## 2016-04-04 MED ORDER — METRONIDAZOLE 500 MG PO TABS: 500.0000 mg | ORAL_TABLET | Freq: Two times a day (BID) | ORAL | Status: DC

## 2016-04-04 MED ORDER — METRONIDAZOLE 0.75 % VA GEL: 1.0000 | Freq: Two times a day (BID) | VAGINAL | Status: DC

## 2016-04-05 LAB — RPR: RPR Ser Ql: NONREACTIVE

## 2016-04-05 LAB — HIV ANTIBODY (ROUTINE TESTING W REFLEX): HIV SCREEN 4TH GENERATION: NONREACTIVE

## 2016-04-07 LAB — GC/CHLAMYDIA PROBE AMP (~~LOC~~) NOT AT ARMC
Chlamydia: NEGATIVE
Neisseria Gonorrhea: NEGATIVE

## 2016-10-17 ENCOUNTER — Encounter (HOSPITAL_BASED_OUTPATIENT_CLINIC_OR_DEPARTMENT_OTHER): Payer: Self-pay | Admitting: *Deleted

## 2016-10-17 ENCOUNTER — Emergency Department (HOSPITAL_BASED_OUTPATIENT_CLINIC_OR_DEPARTMENT_OTHER)
Admission: EM | Admit: 2016-10-17 | Discharge: 2016-10-17 | Disposition: A | Discharge status: Home / Self Care | Attending: Emergency Medicine | Admitting: Emergency Medicine

## 2016-10-17 DIAGNOSIS — J069 Acute upper respiratory infection, unspecified: Secondary | ICD-10-CM | POA: Diagnosis not present

## 2016-10-17 DIAGNOSIS — Z79899 Other long term (current) drug therapy: Secondary | ICD-10-CM | POA: Insufficient documentation

## 2016-10-17 DIAGNOSIS — R05 Cough: Secondary | ICD-10-CM | POA: Diagnosis present

## 2016-10-17 NOTE — Discharge Instructions (Signed)
Do not reuse your contact lenses and do not use any contact lenses until you are cleared by the eye doctor.   Wash your hands frequently and try to keep your hands away from the affected eye(s).   You should be feeling some improvement by 48 hours. If symptoms worsen, you develop pain, change in your vision or no improvement in 48 hours please follow with the ophthalmologist or, if that is not possible, return to the emergency room for a recheck.  You can use Flonase nasal spray which is avilable over the counter. Use nasal saline (you can try [employer] Simply Saline) at least 4 times a day, use saline 5-10 minutes before using the fluticasone (flonase) nasal spray  Do not use Afrin (Oxymetazoline)  Rest, wash hands frequently  and drink plenty of water.  You may try over the counter medication such as allegra-decongestant or claritin-decongestant and/or Mucinex or decongestants.  Push fluids to try to thin the mucus and get it to flow out the nose.

## 2016-10-17 NOTE — ED Provider Notes (Signed)
[facility] Provider Note   CSN: [id number] Arrival date & time: 10/17/16  [id number]    History   Chief Complaint Chief Complaint  Patient presents with  . Cough   HPI   Blood pressure 133/74, pulse 88, temperature 98.4 F (36.9 C), temperature source Oral, height 5\' 6"  (1.676 m), weight 90.7 kg, last menstrual period 09/09/2016, SpO2 100 %.  Deborah Rodriguez is a 30 y.o. female complaining of Runny nose sinus congestion, popping sensation between the ENT compartments. Intermittently productive cough, mild left neck and low back pain. Patient denies chest pain, shortness of breath, nausea, vomiting, change in bowel or bladder habits. Endorses tactile fever and chills on review of systems. No history of asthma, COPD, tobacco use.   Past Medical History:  Diagnosis Date  . Anxiety   . Preeclampsia     There are no active problems to display for this patient.   Past Surgical History:  Procedure Laterality Date  . CESAREAN SECTION    . TONSILLECTOMY      OB History    Gravida Para Term Preterm AB Living   5 4 3 1 1      SAB TAB Ectopic Multiple Live Births     1             Home Medications    Prior to Admission medications   Medication Sig Start Date End Date Taking? Authorizing Provider  cholecalciferol (VITAMIN D) 1000 units tablet Take 1,000 Units by mouth daily.   Yes Historical Provider, MD  fluticasone (FLONASE) 50 MCG/ACT nasal spray Place 1 spray into both nostrils daily as needed for allergies or rhinitis.   Yes Historical Provider, MD  Multiple Vitamin (MULTIVITAMIN WITH MINERALS) TABS tablet Take 1 tablet by mouth daily.   Yes Historical Provider, MD  norethindrone-ethinyl estradiol (JUNEL FE,GILDESS FE,LOESTRIN FE) 1-20 MG-MCG tablet Take 1 tablet by mouth at bedtime.   Yes Historical Provider, MD    Family History Family History  Problem Relation Age of Onset  . Diabetes Maternal Grandfather   . Stroke Paternal Grandfather     Social  History Social History  Substance Use Topics  . Smoking status: Never Smoker  . Smokeless tobacco: Never Used  . Alcohol use Yes     Comment: occ     Allergies   Review of patient's allergies indicates no known allergies.   Review of Systems Review of Systems  10 systems reviewed and found to be negative, except as noted in the HPI.   Physical Exam Updated Vital Signs BP 133/74 (BP Location: Right Arm)   Pulse 88   Temp 98.4 F (36.9 C) (Oral)   Ht 5\' 6"  (1.676 m)   Wt 90.7 kg   LMP 09/09/2016   SpO2 100%   BMI 32.28 kg/m   Physical Exam  Constitutional: She appears well-developed and well-nourished.  HENT:  Head: Normocephalic.  Right Ear: External ear normal.  Left Ear: External ear normal.  Mouth/Throat: Oropharynx is clear and moist. No oropharyngeal exudate.  No drooling or stridor. Posterior pharynx mildly erythematous no significant tonsillar hypertrophy. No exudate. Soft palate rises symmetrically. No TTP or induration under tongue.   No tenderness to palpation of frontal or bilateral maxillary sinuses.  Mild mucosal edema in the nares with scant rhinorrhea.  Bilateral tympanic membranes with normal architecture and good light reflex.    Eyes: Conjunctivae, EOM and lids are normal. Pupils are equal, round, and reactive to light. Right conjunctiva is not injected. Left  conjunctiva is not injected. Right eye exhibits normal extraocular motion and no nystagmus. Left eye exhibits normal extraocular motion and no nystagmus. Right pupil is round and reactive. Left pupil is round and reactive. Pupils are equal.  Neck: Normal range of motion. Neck supple.  No midline C-spine  tenderness to palpation or step-offs appreciated. Patient has full range of motion without pain.  Grip strength, biceps, triceps 5/5 bilaterally;  can differentiate between pinprick and light touch bilaterally.   Cardiovascular: Normal rate and regular rhythm.  Exam reveals no gallop and no  friction rub.   No murmur heard. Pulmonary/Chest: Effort normal and breath sounds normal. No stridor. No respiratory distress. She has no wheezes. She has no rales. She exhibits no tenderness.  Abdominal: Soft. She exhibits no distension and no mass. There is no tenderness. There is no rebound and no guarding. No hernia.  Skin: Capillary refill takes less than 2 seconds.  Nursing note and vitals reviewed.    ED Treatments / Results  Labs (all labs ordered are listed, but only abnormal results are displayed) Labs Reviewed - No data to display  EKG  EKG Interpretation None       Radiology No results found.  Procedures Procedures (including critical care time)  Medications Ordered in ED Medications - No data to display   Initial Impression / Assessment and Plan / ED Course  I have reviewed the triage vital signs and the nursing notes.  Pertinent labs & imaging results that were available during my care of the patient were reviewed by me and considered in my medical decision making (see chart for details).  Clinical Course    Vitals:   10/17/16 0853 10/17/16 0856  BP:  133/74  Pulse:  88  Temp:  98.4 F (36.9 C)  TempSrc:  Oral  SpO2:  100%  Weight: 90.7 kg   Height: 5\' 6"  (1.676 m)     Deborah Rodriguez is 30 y.o. female presenting with Rhinorrhea, mild cough, THIS morning with eyes crusted shut. Patient does wear contact lenses, on my exam no injection or discharge. I've counseled her to not wear contact lenses, to transition to glasses for the next week. Patient afebrile, overall well-appearing, lung sounds clear to auscultation. I doubt this is a pneumonia. She saturating well on room air. There is no tachypnea or tachycardia. I feel this is a viral upper respiratory infection. I've advised her to rest, push fluids, take ibuprofen for the mild muscle aches and pains (there are no meningeal signs). Work note provided, extended discussion of return  precautions.  Evaluation does not show pathology that would require ongoing emergent intervention or inpatient treatment. Pt is hemodynamically stable and mentating appropriately. Discussed findings and plan with patient/guardian, who agrees with care plan. All questions answered. Return precautions discussed and outpatient follow up given.      Final Clinical Impressions(s) / ED Diagnoses   Final diagnoses:  Acute upper respiratory infection    New Prescriptions New Prescriptions   No medications on file     Wynetta Emery, PA-C 10/17/16 0454    Geoffery Lyons, MD 10/17/16 1000

## 2016-10-17 NOTE — ED Triage Notes (Signed)
C/o fever and cough yesterday. Congestion and runny nose. Cough with green sputum. Neck and back pain.

## 2017-01-12 ENCOUNTER — Encounter (HOSPITAL_BASED_OUTPATIENT_CLINIC_OR_DEPARTMENT_OTHER): Payer: Self-pay | Admitting: *Deleted

## 2017-01-12 ENCOUNTER — Emergency Department (HOSPITAL_BASED_OUTPATIENT_CLINIC_OR_DEPARTMENT_OTHER)
Admission: EM | Admit: 2017-01-12 | Discharge: 2017-01-12 | Disposition: A | Discharge status: Home / Self Care | Attending: Emergency Medicine | Admitting: Emergency Medicine

## 2017-01-12 DIAGNOSIS — Z79899 Other long term (current) drug therapy: Secondary | ICD-10-CM | POA: Insufficient documentation

## 2017-01-12 DIAGNOSIS — R110 Nausea: Secondary | ICD-10-CM | POA: Diagnosis not present

## 2017-01-12 DIAGNOSIS — R109 Unspecified abdominal pain: Secondary | ICD-10-CM

## 2017-01-12 DIAGNOSIS — N939 Abnormal uterine and vaginal bleeding, unspecified: Secondary | ICD-10-CM | POA: Diagnosis not present

## 2017-01-12 DIAGNOSIS — R1010 Upper abdominal pain, unspecified: Secondary | ICD-10-CM | POA: Diagnosis present

## 2017-01-12 DIAGNOSIS — R1032 Left lower quadrant pain: Secondary | ICD-10-CM | POA: Diagnosis not present

## 2017-01-12 LAB — COMPREHENSIVE METABOLIC PANEL
ALBUMIN: 4.1 g/dL (ref 3.5–5.0)
ALT: 33 U/L (ref 14–54)
ANION GAP: 7 (ref 5–15)
AST: 30 U/L (ref 15–41)
Alkaline Phosphatase: 86 U/L (ref 38–126)
BILIRUBIN TOTAL: 0.5 mg/dL (ref 0.3–1.2)
BUN: 11 mg/dL (ref 6–20)
CO2: 27 mmol/L (ref 22–32)
Calcium: 8.5 mg/dL — ABNORMAL LOW (ref 8.9–10.3)
Chloride: 102 mmol/L (ref 101–111)
Creatinine, Ser: 0.7 mg/dL (ref 0.44–1.00)
GFR calc Af Amer: >60 mL/min (ref >60)
GFR calc non Af Amer: >60 mL/min (ref >60)
Glucose, Bld: 95 mg/dL (ref 65–99)
POTASSIUM: 3.8 mmol/L (ref 3.5–5.1)
Sodium: 136 mmol/L (ref 135–145)
TOTAL PROTEIN: 7.6 g/dL (ref 6.5–8.1)

## 2017-01-12 LAB — URINALYSIS, ROUTINE W REFLEX MICROSCOPIC
GLUCOSE, UA: NEGATIVE mg/dL
Hgb urine dipstick: NEGATIVE
KETONES UR: NEGATIVE mg/dL
LEUKOCYTES UA: NEGATIVE
NITRITE: NEGATIVE
PROTEIN: NEGATIVE mg/dL
Specific Gravity, Urine: 1.025 (ref 1.005–1.030)
pH: 5.5 (ref 5.0–8.0)

## 2017-01-12 LAB — CBC
HEMATOCRIT: 37.9 % (ref 36.0–46.0)
HEMOGLOBIN: 12.5 g/dL (ref 12.0–15.0)
MCH: 25 pg — ABNORMAL LOW (ref 26.0–34.0)
MCHC: 33 g/dL (ref 30.0–36.0)
MCV: 75.8 fL — ABNORMAL LOW (ref 78.0–100.0)
Platelets: 269 10*3/uL (ref 150–400)
RBC: 5 MIL/uL (ref 3.87–5.11)
RDW: 14.2 % (ref 11.5–15.5)
WBC: 3.6 10*3/uL — AB (ref 4.0–10.5)

## 2017-01-12 LAB — LIPASE, BLOOD: Lipase: 17 U/L (ref 11–51)

## 2017-01-12 LAB — WET PREP, GENITAL
SPERM: NONE SEEN
TRICH WET PREP: NONE SEEN
YEAST WET PREP: NONE SEEN

## 2017-01-12 LAB — PREGNANCY, URINE: PREG TEST UR: NEGATIVE

## 2017-01-12 NOTE — ED Notes (Signed)
ED Provider at bedside. 

## 2017-01-12 NOTE — ED Triage Notes (Signed)
Pt reports abdominal "cramp" since yesterday. Indicated location upper abdomen. States had menstrual cycle 12/22/16-12/27/16 and again 01/07/17-01/10/17. Finished Flagyl mid January as tx for BV

## 2017-01-12 NOTE — ED Provider Notes (Signed)
[facility]   CSN: [id number] Arrival date & time: 01/12/17  0845     History   Chief Complaint Chief Complaint  Patient presents with  . Abdominal Pain    HPI Deborah Rodriguez is a 31 y.o. female with history of recent BV and yeast infection presents the emergency department today complaining of abdominal "cramping" since yesterday. Patient reports that her abdominal "cramping" is intermittent spasming, located upper abdomen, nonradiating to the back, lasting for about 10 seconds, 10/10 when it occurs. She admits to associated nausea and not wanting to eat secondary to her nausea. Patient states that lying down makes her symptoms better. Nothing else makes it better or worse. She denies any recent stressors, new movements, exercise, or activity. She denies any history of GERD. Patient reports recently being treated for BV and yeast infection and finished treatment about last week. She states that her symptoms are not similar to her BV and yeast infection symptoms. She also reports having had 2 menstrual cycles one being on January 1 and second menstrual cycle on the 17th. Patient states this is never happened before, and she does have a gynecologist. Patient admits to being sexually active and has had intercourse since she is finish the antibiotics. Patient denies chest pain, shortness of breath, vomiting, fevers, chills, changes in urination, dysuria, changes in bowel movements, vaginal pain, vaginal discharge, vaginal bleeding, foul odor.  The history is provided by the patient. No language interpreter was used.  Abdominal Pain   Associated symptoms include nausea. Pertinent negatives include fever, diarrhea, vomiting and dysuria.    Past Medical History:  Diagnosis Date  . Anxiety   . Preeclampsia     There are no active problems to display for this patient.   Past Surgical History:  Procedure Laterality Date  . CESAREAN SECTION    . TONSILLECTOMY       OB History    Gravida Para Term Preterm AB Living   5 4 3 1 1      SAB TAB Ectopic Multiple Live Births     1             Home Medications    Prior to Admission medications   Medication Sig Start Date End Date Taking? Authorizing Provider  Multiple Vitamin (MULTIVITAMIN WITH MINERALS) TABS tablet Take 1 tablet by mouth daily.   Yes Historical Provider, MD  cholecalciferol (VITAMIN D) 1000 units tablet Take 1,000 Units by mouth daily.    Historical Provider, MD  fluticasone (FLONASE) 50 MCG/ACT nasal spray Place 1 spray into both nostrils daily as needed for allergies or rhinitis.    Historical Provider, MD  norethindrone-ethinyl estradiol (JUNEL FE,GILDESS FE,LOESTRIN FE) 1-20 MG-MCG tablet Take 1 tablet by mouth at bedtime.    Historical Provider, MD    Family History Family History  Problem Relation Age of Onset  . Diabetes Maternal Grandfather   . Stroke Paternal Grandfather     Social History Social History  Substance Use Topics  . Smoking status: Never Smoker  . Smokeless tobacco: Never Used  . Alcohol use Yes     Comment: occ     Allergies   Patient has no known allergies.   Review of Systems Review of Systems  Constitutional: Negative for chills and fever.  HENT: Negative for trouble swallowing.   Eyes: Negative for visual disturbance.  Respiratory: Negative for cough and shortness of breath.   Cardiovascular: Negative for chest pain.  Gastrointestinal: Positive for abdominal pain  and nausea. Negative for diarrhea and vomiting.  Genitourinary: Negative for difficulty urinating, dysuria, vaginal bleeding, vaginal discharge and vaginal pain.  Musculoskeletal: Negative for neck pain and neck stiffness.  Skin: Negative for rash and wound.  Neurological: Negative for numbness.     Physical Exam Updated Vital Signs BP 122/78 (BP Location: Right Arm)   Pulse 80   Temp 98.5 F (36.9 C) (Oral)   Resp 18   Ht 5\' 6"  (1.676 m)   Wt 95.3 kg   LMP  01/07/2017   SpO2 100%   BMI 33.89 kg/m   Physical Exam  Constitutional: She is oriented to person, place, and time. She appears well-developed and well-nourished.  HENT:  Head: Normocephalic and atraumatic.  Nose: Nose normal.  Eyes: Conjunctivae and EOM are normal. Pupils are equal, round, and reactive to light.  Neck: Normal range of motion. Neck supple.  Cardiovascular: Normal rate and normal heart sounds.   Pulmonary/Chest: Effort normal and breath sounds normal. No respiratory distress. She exhibits no tenderness.  Abdominal: Soft. Bowel sounds are normal. There is tenderness. There is no rigidity, no rebound, no guarding, no CVA tenderness, no tenderness at McBurney's point and negative Murphy's sign. No hernia. Hernia confirmed negative in the right inguinal area and confirmed negative in the left inguinal area.  Soft. Mild tenderness to upper abdomen, and left lower quadrant. No rebound, guarding, rigidity. Negative Murphy sign. No focal tenderness at McBurney's point.  Genitourinary: Pelvic exam was performed with patient supine. There is no rash, tenderness, lesion or injury on the right labia. There is no rash, tenderness, lesion or injury on the left labia. Cervix exhibits no motion tenderness and no discharge. There is bleeding (Scant) in the vagina. No erythema or tenderness in the vagina. No foreign body in the vagina. No signs of injury around the vagina. No vaginal discharge found.  Genitourinary Comments: Chaperone present  Musculoskeletal: Normal range of motion.  Neurological: She is alert and oriented to person, place, and time.  Skin: Skin is warm. Capillary refill takes less than 2 seconds.  Psychiatric: She has a normal mood and affect. Her behavior is normal.  Nursing note and vitals reviewed.    ED Treatments / Results  Labs (all labs ordered are listed, but only abnormal results are displayed) Labs Reviewed  WET PREP, GENITAL - Abnormal; Notable for the  following:       Result Value   Clue Cells Wet Prep HPF POC PRESENT (*)    WBC, Wet Prep HPF POC MANY (*)    All other components within normal limits  URINALYSIS, ROUTINE W REFLEX MICROSCOPIC - Abnormal; Notable for the following:    Bilirubin Urine SMALL (*)    All other components within normal limits  COMPREHENSIVE METABOLIC PANEL - Abnormal; Notable for the following:    Calcium 8.5 (*)    All other components within normal limits  CBC - Abnormal; Notable for the following:    WBC 3.6 (*)    MCV 75.8 (*)    MCH 25.0 (*)    All other components within normal limits  PREGNANCY, URINE  LIPASE, BLOOD  RPR  HIV ANTIBODY (ROUTINE TESTING)  GC/CHLAMYDIA PROBE AMP ([place]) NOT AT [facility]    EKG  EKG Interpretation None       Radiology No results found.  Procedures Procedures (including critical care time)  Medications Ordered in ED Medications - No data to display   Initial Impression / Assessment and Plan / ED  Course  I have reviewed the triage vital signs and the nursing notes.  Pertinent labs & imaging results that were available during my care of the patient were reviewed by me and considered in my medical decision making (see chart for details).       Patient is a 31 yo female presents with abdominal cramping since yesterday . On exam, nontoxic, nonseptic appearing, in no apparent distress. On exam patient afebrile, vital signs stable, no pain or distress. Heart and lung sounds clear. Abdomen soft with mild tenderness and upper abdomen and mild tenderness in left lower quadrant. No rebound, guarding, rigidity.  Labs and vitals reviewed. Lab work shows no signs of acute processes. UA shows no evidence of acute infection. Pregnancy test negative. Pelvic exam had scant blood in the vagina, which may be due to her ending her menstrual period cycle. No significant discharge or odor noted. No cervical motion tenderness. No trauma noted. Lab work shows that she has  presence of clue cells, but since she did not have significant amount of discharge or odor patient will not be treated for that at this time. Patient does not meet the SIRS or Sepsis criteria.  On repeat exam patient does not have a surgical abdomen and there are no peritoneal signs. No focal tenderness at McBurney's point and negative Murphy's sign. No indication of appendicitis, bowel obstruction, bowel perforation, cholecystitis, diverticulitis, PID or ectopic pregnancy. Patient discharged home with symptomatic treatment and given strict instructions for follow-up with her primary care physician and gynecologist regarding today's visit. I have also discussed reasons to return immediately to the ER. Patient expresses understanding and agrees with plan.  Final Clinical Impressions(s) / ED Diagnoses   Final diagnoses:  Abdominal pain, unspecified abdominal location    New Prescriptions Discharge Medication List as of 01/12/2017 11:33 AM       [address] DentonEspina, GeorgiaPA 01/12/17 1736    Benjiman CoreNathan Pickering, MD 01/15/17 [phone]

## 2017-01-12 NOTE — Discharge Instructions (Signed)
You will be called in 2-5 days regarding the pending results of the pelvic exam. Please follow up with your primary care physician and gynecologist in 2-5 days. Drink plenty of fluids throughout the day. Take Tylenol or ibuprofen every 6 hours as needed for pain. Try warm compress to the area for 15 minutes 3-5 times a day.  Get help right away if: Your pain does not go away as soon as your health care provider told you to expect. You cannot stop throwing up. Your pain is only in areas of the abdomen, such as the right side or the left lower portion of the abdomen. You have bloody or black stools, or stools that look like tar. You have severe pain, cramping, or bloating in your abdomen. You have signs of dehydration, such as: Dark urine, very little urine, or no urine. Cracked lips. Dry mouth. Sunken eyes. Sleepiness. Weakness.

## 2017-01-13 LAB — HIV ANTIBODY (ROUTINE TESTING W REFLEX): HIV SCREEN 4TH GENERATION: NONREACTIVE

## 2017-01-13 LAB — GC/CHLAMYDIA PROBE AMP (~~LOC~~) NOT AT ARMC
CHLAMYDIA, DNA PROBE: NEGATIVE
NEISSERIA GONORRHEA: NEGATIVE

## 2017-01-13 LAB — RPR: RPR: NONREACTIVE

## 2017-04-02 ENCOUNTER — Emergency Department (HOSPITAL_BASED_OUTPATIENT_CLINIC_OR_DEPARTMENT_OTHER)
Admission: EM | Admit: 2017-04-02 | Discharge: 2017-04-02 | Disposition: A | Discharge status: Home / Self Care | Attending: Emergency Medicine | Admitting: Emergency Medicine

## 2017-04-02 ENCOUNTER — Encounter (HOSPITAL_BASED_OUTPATIENT_CLINIC_OR_DEPARTMENT_OTHER): Payer: Self-pay | Admitting: Emergency Medicine

## 2017-04-02 DIAGNOSIS — J029 Acute pharyngitis, unspecified: Secondary | ICD-10-CM | POA: Insufficient documentation

## 2017-04-02 DIAGNOSIS — Z3A01 Less than 8 weeks gestation of pregnancy: Secondary | ICD-10-CM | POA: Insufficient documentation

## 2017-04-02 DIAGNOSIS — O99511 Diseases of the respiratory system complicating pregnancy, first trimester: Secondary | ICD-10-CM | POA: Insufficient documentation

## 2017-04-02 LAB — RAPID STREP SCREEN (MED CTR MEBANE ONLY): Streptococcus, Group A Screen (Direct): NEGATIVE

## 2017-04-02 LAB — PREGNANCY, URINE: PREG TEST UR: POSITIVE — AB

## 2017-04-02 NOTE — ED Triage Notes (Signed)
Patient states that for the last 2 days she has had a sore throat. Denies any fever or chills. The patient reports that she also may be pregnant

## 2017-04-02 NOTE — Discharge Instructions (Signed)
As discussed, please use normal saline nasal sprays. Continue with tea and honey to soothe throat. Follow-up with your primary care provider. Return to the emergency department if you have increased swelling in your throat, can't swallow, shortness of breath, or any other new concerning symptoms.

## 2017-04-02 NOTE — ED Notes (Signed)
ED Provider at bedside. 

## 2017-04-02 NOTE — ED Provider Notes (Signed)
[facility]   CSN: [id number] Arrival date & time: 04/02/17  1452     History   Chief Complaint Chief Complaint  Patient presents with  . Sore Throat    HPI Deborah Rodriguez is a 31 y.o. female presenting with 2 days of sharp sore throat which is worse in the morning and subsides throughout the day with the use of tea and honey. She has not tried anything other than tea at home. Denies fever, chills, nausea, vomiting, or any other cold symptoms.   HPI  Past Medical History:  Diagnosis Date  . Anxiety   . Preeclampsia     There are no active problems to display for this patient.   Past Surgical History:  Procedure Laterality Date  . CESAREAN SECTION    . TONSILLECTOMY      OB History    Gravida Para Term Preterm AB Living   SAB TAB Ectopic Multiple Live Births     1             Home Medications    Prior to Admission medications   Medication Sig Start Date End Date Taking? Authorizing Provider  cholecalciferol (VITAMIN D) 1000 units tablet Take 1,000 Units by mouth daily.    Historical Provider, MD  fluticasone (FLONASE) 50 MCG/ACT nasal spray Place 1 spray into both nostrils daily as needed for allergies or rhinitis.    Historical Provider, MD  Multiple Vitamin (MULTIVITAMIN WITH MINERALS) TABS tablet Take 1 tablet by mouth daily.    Historical Provider, MD  norethindrone-ethinyl estradiol (JUNEL FE,GILDESS FE,LOESTRIN FE) 1-20 MG-MCG tablet Take 1 tablet by mouth at bedtime.    Historical Provider, MD    Family History Family History  Problem Relation Age of Onset  . Diabetes Maternal Grandfather   . Stroke Paternal Grandfather     Social History Social History  Substance Use Topics  . Smoking status: Never Smoker  . Smokeless tobacco: Never Used  . Alcohol use Yes     Comment: occ     Allergies   Patient has no known allergies.   Review of Systems Review of Systems  Constitutional: Negative for  chills and fever.  HENT: Positive for postnasal drip and sore throat. Negative for congestion, dental problem, ear pain, facial swelling, hearing loss, tinnitus, trouble swallowing and voice change.   Respiratory: Negative for cough, shortness of breath, wheezing and stridor.   Cardiovascular: Negative for chest pain and palpitations.  Gastrointestinal: Negative for abdominal pain, nausea and vomiting.  Genitourinary: Negative for difficulty urinating, dysuria and hematuria.  Musculoskeletal: Negative for arthralgias, back pain, myalgias, neck pain and neck stiffness.  Skin: Negative for color change, pallor and rash.  Neurological: Negative for seizures, syncope and headaches.     Physical Exam Updated Vital Signs BP 120/67 (BP Location: Right Arm)   Pulse 85   Temp 98.4 F (36.9 C) (Oral)   Resp 18   Ht  (1.702 m)   Wt 99.8 kg   LMP 02/10/2017   SpO2 100%   BMI 34.46 kg/m   Physical Exam  Constitutional: She appears well-developed and well-nourished. No distress.  Afebrile, nontoxic-appearing, sitting comfortably in chair in no acute distress.  HENT:  Head: Normocephalic and atraumatic.  Right Ear: External ear normal.  Left Ear: External ear normal.  Mouth/Throat: Oropharynx is clear and moist. No oropharyngeal exudate.  Tympanic membranes normal bilaterally  Eyes: Conjunctivae and EOM  are normal. Pupils are equal, round, and reactive to light. Right eye exhibits no discharge. Left eye exhibits no discharge. No scleral icterus.  Neck: Normal range of motion. Neck supple.  Right-sided cervical adenopathy  Cardiovascular: Normal rate, regular rhythm and normal heart sounds.   No murmur heard. Pulmonary/Chest: Effort normal and breath sounds normal. No respiratory distress. She has no wheezes. She has no rales. She exhibits no tenderness.  Musculoskeletal: She exhibits no edema or deformity.  Lymphadenopathy:    She has cervical adenopathy.  Neurological: She is  alert.  Skin: Skin is warm and dry. No rash noted. She is not diaphoretic. No erythema. No pallor.  Psychiatric: She has a normal mood and affect.  Nursing note and vitals reviewed.    ED Treatments / Results  Labs (all labs ordered are listed, but only abnormal results are displayed) Labs Reviewed  PREGNANCY, URINE - Abnormal; Notable for the following:       Result Value   Preg Test, Ur POSITIVE (*)    All other components within normal limits  RAPID STREP SCREEN (NOT AT [place])  CULTURE, GROUP A STREP [facility])    EKG  EKG Interpretation None       Radiology No results found.  Procedures Procedures (including critical care time)  Medications Ordered in ED Medications - No data to display   Initial Impression / Assessment and Plan / ED Course  I have reviewed the triage vital signs and the nursing notes.  Pertinent labs & imaging results that were available during my care of the patient were reviewed by me and considered in my medical decision making (see chart for details).     Patient presenting with 2 days of sore throat worse in the morning and resolving as the day progresses. She endorses postnasal drip and a history of allergies during the springtime. Rapid strep negative from triage. Pregnancy test was ordered in triage and returned positive. Patient had mentioned being two weeks late and suspecting that she was pregnant.  Discussed results with patient.  I suspect that her sore throat is likely due to current pollen and seasonal allergy with postnasal drip irritating her throat. Advised to use normal saline nasal sprays and continued tea with honey.  Exam is otherwise unremarkable. No oropharynx erythema or exudate. No voice change or difficulty with oral intake.  Discharge home with conservative management and follow-up with PCP.  Discussed strict return precautions and advised to return to the emergency department if experiencing any new or worsening  symptoms. Instructions were understood and patient agreed with discharge plan.  Final Clinical Impressions(s) / ED Diagnoses   Final diagnoses:  Sore throat    New Prescriptions Discharge Medication List as of 04/02/2017  3:43 PM       [place]iana Shore, PA-C 04/02/17 1706    Alvira Monday, MD 04/03/17 [phone]

## 2017-04-05 LAB — CULTURE, GROUP A STREP (THRC)

## 2017-05-29 ENCOUNTER — Emergency Department (HOSPITAL_BASED_OUTPATIENT_CLINIC_OR_DEPARTMENT_OTHER)
Admission: EM | Admit: 2017-05-29 | Discharge: 2017-05-29 | Disposition: A | Discharge status: Home / Self Care | Attending: Emergency Medicine | Admitting: Emergency Medicine

## 2017-05-29 ENCOUNTER — Encounter (HOSPITAL_BASED_OUTPATIENT_CLINIC_OR_DEPARTMENT_OTHER): Payer: Self-pay | Admitting: *Deleted

## 2017-05-29 DIAGNOSIS — R300 Dysuria: Secondary | ICD-10-CM | POA: Diagnosis present

## 2017-05-29 DIAGNOSIS — N3000 Acute cystitis without hematuria: Secondary | ICD-10-CM

## 2017-05-29 DIAGNOSIS — Z79899 Other long term (current) drug therapy: Secondary | ICD-10-CM | POA: Diagnosis not present

## 2017-05-29 DIAGNOSIS — N390 Urinary tract infection, site not specified: Secondary | ICD-10-CM | POA: Diagnosis not present

## 2017-05-29 LAB — PREGNANCY, URINE: PREG TEST UR: POSITIVE — AB

## 2017-05-29 LAB — URINALYSIS, ROUTINE W REFLEX MICROSCOPIC
BILIRUBIN URINE: NEGATIVE
Glucose, UA: NEGATIVE mg/dL
KETONES UR: NEGATIVE mg/dL
Nitrite: NEGATIVE
PROTEIN: 100 mg/dL — AB
SPECIFIC GRAVITY, URINE: 1.024 (ref 1.005–1.030)
pH: 6.5 (ref 5.0–8.0)

## 2017-05-29 LAB — URINALYSIS, MICROSCOPIC (REFLEX)

## 2017-05-29 MED ORDER — CEPHALEXIN 500 MG PO CAPS: 500.0000 mg | ORAL_CAPSULE | Freq: Three times a day (TID) | ORAL | 0 refills | Status: DC

## 2017-05-29 MED ORDER — CEPHALEXIN 250 MG PO CAPS
500.0000 mg | ORAL_CAPSULE | Freq: Once | ORAL | Status: AC
  Administered 2017-05-29: 500 mg via ORAL
  Filled 2017-05-29: qty 2

## 2017-05-29 MED ORDER — PHENAZOPYRIDINE HCL 100 MG PO TABS
200.0000 mg | ORAL_TABLET | Freq: Once | ORAL | Status: AC
  Administered 2017-05-29: 200 mg via ORAL
  Filled 2017-05-29: qty 2

## 2017-05-29 MED FILL — CEPHALEXIN 500 MG CAPSULE: 500 | 5 days supply | Qty: 15 | Fill #0

## 2017-05-29 NOTE — ED Provider Notes (Signed)
[facility]   CSN: [id number] Arrival date & time: 05/29/17  1334     History   Chief Complaint Chief Complaint  Patient presents with  . Dysuria    HPI Deborah Rodriguez is a 31 y.o. female.  HPI Patient reports pain for urination for the past 3 days.  No fevers or chills.  Denies back or flank pain.  Denies nausea vomiting.  Patient with recent positive pregnancy test and this was followed by an abortion.  She is following closely with her OB/GYN team.  No vaginal bleeding at this time.  No lower abdominal pain or cramping.  Symptoms are mild in severity   Past Medical History:  Diagnosis Date  . Anxiety   . Preeclampsia     There are no active problems to display for this patient.   Past Surgical History:  Procedure Laterality Date  . CESAREAN SECTION    . TONSILLECTOMY      OB History    Gravida Para Term Preterm AB Living   5 4 3 1 1      SAB TAB Ectopic Multiple Live Births     1             Home Medications    Prior to Admission medications   Medication Sig Start Date End Date Taking? Authorizing Provider  cephALEXin (KEFLEX) 500 MG capsule Take 1 capsule (500 mg total) by mouth 3 (three) times daily. 05/29/17   Azalia Bilisampos, Jaymi Tinner, MD  cholecalciferol (VITAMIN D) 1000 units tablet Take 1,000 Units by mouth daily.    [provider]  fluticasone (FLONASE) 50 MCG/ACT nasal spray Place 1 spray into both nostrils daily as needed for allergies or rhinitis.    [provider]  Multiple Vitamin (MULTIVITAMIN WITH MINERALS) TABS tablet Take 1 tablet by mouth daily.    [provider]  norethindrone-ethinyl estradiol (JUNEL FE,GILDESS FE,LOESTRIN FE) 1-20 MG-MCG tablet Take 1 tablet by mouth at bedtime.    [provider]    Family History Family History  Problem Relation Age of Onset  . Diabetes Maternal Grandfather   . Stroke Paternal Grandfather     Social History Social History  Substance Use Topics   . Smoking status: Never Smoker  . Smokeless tobacco: Never Used  . Alcohol use Yes     Comment: occ     Allergies   Patient has no known allergies.   Review of Systems Review of Systems  All other systems reviewed and are negative.    Physical Exam Updated Vital Signs BP 125/75   Pulse 77   Temp 98 F (36.7 C) (Oral)   Resp 16   Ht 5\' 7"  (1.702 m)   Wt 99.8 kg (220 lb)   SpO2 100%   BMI 34.46 kg/m   Physical Exam  Constitutional: She is oriented to person, place, and time. She appears well-developed and well-nourished.  HENT:  Head: Normocephalic.  Eyes: EOM are normal.  Neck: Normal range of motion.  Pulmonary/Chest: Effort normal.  Abdominal: She exhibits no distension.  Musculoskeletal: Normal range of motion.  Neurological: She is alert and oriented to person, place, and time.  Psychiatric: She has a normal mood and affect.  Nursing note and vitals reviewed.    ED Treatments / Results  Labs (all labs ordered are listed, but only abnormal results are displayed) Labs Reviewed  URINALYSIS, ROUTINE W REFLEX MICROSCOPIC - Abnormal; Notable for the following:       Result  Value   APPearance TURBID (*)    Hgb urine dipstick LARGE (*)    Protein, ur 100 (*)    Leukocytes, UA LARGE (*)    All other components within normal limits  PREGNANCY, URINE - Abnormal; Notable for the following:    Preg Test, Ur POSITIVE (*)    All other components within normal limits  URINALYSIS, MICROSCOPIC (REFLEX) - Abnormal; Notable for the following:    Bacteria, UA MANY (*)    Squamous Epithelial / LPF 0-5 (*)    All other components within normal limits  URINE CULTURE    EKG  EKG Interpretation None       Radiology No results found.  Procedures Procedures (including critical care time)  Medications Ordered in ED Medications - No data to display   Initial Impression / Assessment and Plan / ED Course  I have reviewed the triage vital signs and the nursing  notes.  Pertinent labs & imaging results that were available during my care of the patient were reviewed by me and considered in my medical decision making (see chart for details).     Patient be treated for urinary tract infection.  Close OB follow-up.  No indication for ultrasound imaging today.  Final Clinical Impressions(s) / ED Diagnoses   Final diagnoses:  None    New Prescriptions New Prescriptions   CEPHALEXIN (KEFLEX) 500 MG CAPSULE    Take 1 capsule (500 mg total) by mouth 3 (three) times daily.     Azalia Bilis, MD 05/29/17 1436

## 2017-05-29 NOTE — ED Triage Notes (Signed)
Pt co painful freq urination  X 3 days

## 2017-05-31 LAB — URINE CULTURE: Culture: >=100000 — AB

## 2017-06-01 ENCOUNTER — Telehealth: Payer: Self-pay | Admitting: Emergency Medicine

## 2017-06-01 NOTE — Telephone Encounter (Signed)
Post ED Visit - Positive Culture Follow-up  Culture report reviewed by antimicrobial stewardship pharmacist:  []  Deborah Rodriguez, Pharm.D. []  Deborah Rodriguez, Pharm.D., BCPS AQ-ID []  Deborah Rodriguez, Pharm.D., BCPS [x]  Deborah Rodriguez, Pharm.D., BCPS []  Deborah Rodriguez, [address]Pharm.D., BCPS, AAHIVP []  Deborah Rodriguez, Pharm.D., BCPS, AAHIVP []  Deborah Rodriguez, PharmD, BCPS []  Deborah Rodriguez, PharmD, BCPS []  Deborah Rodriguez, PharmD, BCPS  Positive urine culture Treated with cephalexin, organism sensitive to the same and no further patient follow-up is required at this time.  Deborah Rodriguez, Deborah Rodriguez 06/01/2017, 12:03 PM

## 2017-08-28 ENCOUNTER — Emergency Department (HOSPITAL_BASED_OUTPATIENT_CLINIC_OR_DEPARTMENT_OTHER)
Admission: EM | Admit: 2017-08-28 | Discharge: 2017-08-28 | Disposition: A | Discharge status: Home / Self Care | Attending: Emergency Medicine | Admitting: Emergency Medicine

## 2017-08-28 ENCOUNTER — Encounter: Payer: Self-pay | Admitting: Emergency Medicine

## 2017-08-28 DIAGNOSIS — Z79899 Other long term (current) drug therapy: Secondary | ICD-10-CM | POA: Diagnosis not present

## 2017-08-28 DIAGNOSIS — N3000 Acute cystitis without hematuria: Secondary | ICD-10-CM | POA: Insufficient documentation

## 2017-08-28 DIAGNOSIS — R350 Frequency of micturition: Secondary | ICD-10-CM | POA: Diagnosis present

## 2017-08-28 LAB — URINALYSIS, ROUTINE W REFLEX MICROSCOPIC
Bilirubin Urine: NEGATIVE
GLUCOSE, UA: NEGATIVE mg/dL
KETONES UR: NEGATIVE mg/dL
Nitrite: NEGATIVE
Protein, ur: 30 mg/dL — AB
Specific Gravity, Urine: 1.025 (ref 1.005–1.030)
pH: 7 (ref 5.0–8.0)

## 2017-08-28 LAB — URINALYSIS, MICROSCOPIC (REFLEX)

## 2017-08-28 LAB — PREGNANCY, URINE: Preg Test, Ur: NEGATIVE

## 2017-08-28 MED ORDER — NITROFURANTOIN MONOHYD MACRO 100 MG PO CAPS: 100.0000 mg | ORAL_CAPSULE | Freq: Two times a day (BID) | ORAL | 0 refills | Status: DC

## 2017-08-28 MED FILL — NITROFURANTOIN MONO-MCR 100: 100 | 7 days supply | Qty: 14 | Fill #0

## 2017-08-28 NOTE — Discharge Instructions (Signed)
Take macrobid as prescribed until all gone. Follow up for recheck as needed. Return if worsening symptoms.

## 2017-08-28 NOTE — ED Triage Notes (Addendum)
UTI Sx x 1 week. Denies Vaginaldischarge

## 2017-08-28 NOTE — ED Provider Notes (Signed)
[facility]   CSN: [id number] Arrival date & time: 08/28/17  0849     History   Chief Complaint Chief Complaint  Patient presents with  . Urinary Frequency    HPI Deborah Acreeaundea Leitzke is a 31 y.o. female.  HPI Deborah Rodriguez is a 31 y.o. female presents to emergency department complaining of urinary frequency, urgency, dysuria. She states symptoms started about a week and a half ago. She initially took some Pyridium and try to increase her fluid intake, and states she felt better until this morning. She reports burning sensation at the end of urination. She feels constant urgency to urinate and only little comes out. She denies any abdominal pain. She denies any back or flank pain. She denies any vaginal discharge or bleeding. She denies any concern for an STD. She denies pregnancy. She denies any fever or chills, no nausea or vomiting, no other associated symptoms. She reports history of UTIs, and states this feels similar.  Past Medical History:  Diagnosis Date  . Anxiety   . Preeclampsia     There are no active problems to display for this patient.   Past Surgical History:  Procedure Laterality Date  . CESAREAN SECTION    . TONSILLECTOMY      OB History    Gravida Para Term Preterm AB Living   5 4 3 1 1      SAB TAB Ectopic Multiple Live Births     1             Home Medications    Prior to Admission medications   Medication Sig Start Date End Date Taking? Authorizing Provider  norgestimate-ethinyl estradiol (ORTHO-CYCLEN,SPRINTEC,PREVIFEM) 0.25-35 MG-MCG tablet Take 1 tablet by mouth daily.   Yes [provider]  cephALEXin (KEFLEX) 500 MG capsule Take 1 capsule (500 mg total) by mouth 3 (three) times daily. 05/29/17   Azalia Bilisampos, Kevin, MD  cholecalciferol (VITAMIN D) 1000 units tablet Take 1,000 Units by mouth daily.    [provider]  fluticasone (FLONASE) 50 MCG/ACT nasal spray Place 1 spray into both nostrils daily as needed  for allergies or rhinitis.    [provider]  Multiple Vitamin (MULTIVITAMIN WITH MINERALS) TABS tablet Take 1 tablet by mouth daily.    [provider]  norethindrone-ethinyl estradiol (JUNEL FE,GILDESS FE,LOESTRIN FE) 1-20 MG-MCG tablet Take 1 tablet by mouth at bedtime.    [provider]    Family History Family History  Problem Relation Age of Onset  . Diabetes Maternal Grandfather   . Stroke Paternal Grandfather     Social History Social History  Substance Use Topics  . Smoking status: Never Smoker  . Smokeless tobacco: Never Used  . Alcohol use Yes     Comment: occ     Allergies   Patient has no known allergies.   Review of Systems Review of Systems  Constitutional: Negative for chills and fever.  Respiratory: Negative for cough, chest tightness and shortness of breath.   Cardiovascular: Negative for chest pain, palpitations and leg swelling.  Gastrointestinal: Negative for abdominal pain, diarrhea, nausea and vomiting.  Genitourinary: Positive for dysuria, frequency and urgency. Negative for flank pain, pelvic pain, vaginal bleeding, vaginal discharge and vaginal pain.  Musculoskeletal: Negative for arthralgias, myalgias, neck pain and neck stiffness.  Skin: Negative for rash.  Neurological: Negative for dizziness, weakness and headaches.  All other systems reviewed and are negative.    Physical Exam Updated Vital Signs BP 115/65 (BP Location:  Left Arm)   Pulse 76   Temp 98.8 F (37.1 C) (Oral)   Resp 20   Ht  (1.676 m)   Wt 97.5 kg (215 lb)   LMP 08/24/2017   SpO2 100%   BMI 34.70 kg/m   Physical Exam  Constitutional: She is oriented to person, place, and time. She appears well-developed and well-nourished. No distress.  HENT:  Head: Normocephalic.  Eyes: Conjunctivae are normal.  Neck: Neck supple.  Cardiovascular: Normal rate, regular rhythm and normal heart sounds.   Pulmonary/Chest: Effort normal and breath  sounds normal. No respiratory distress. She has no wheezes. She has no rales.  Abdominal: Soft. Bowel sounds are normal. She exhibits no distension. There is no tenderness. There is no rebound.  No CVA tenderness  Musculoskeletal: She exhibits no edema.  Neurological: She is alert and oriented to person, place, and time.  Skin: Skin is warm and dry.  Psychiatric: She has a normal mood and affect. Her behavior is normal.  Nursing note and vitals reviewed.    ED Treatments / Results  Labs (all labs ordered are listed, but only abnormal results are displayed) Labs Reviewed  URINALYSIS, ROUTINE W REFLEX MICROSCOPIC - Abnormal; Notable for the following:       Result Value   APPearance CLOUDY (*)    Hgb urine dipstick TRACE (*)    Protein, ur 30 (*)    Leukocytes, UA MODERATE (*)    All other components within normal limits  URINALYSIS, MICROSCOPIC (REFLEX) - Abnormal; Notable for the following:    Bacteria, UA MANY (*)    Squamous Epithelial / LPF 0-5 (*)    All other components within normal limits  PREGNANCY, URINE    EKG  EKG Interpretation None       Radiology No results found.  Procedures Procedures (including critical care time)  Medications Ordered in ED Medications - No data to display   Initial Impression / Assessment and Plan / ED Course  I have reviewed the triage vital signs and the nursing notes.  Pertinent labs & imaging results that were available during my care of the patient were reviewed by me and considered in my medical decision making (see chart for details).     Patient to the emergency department with urinary symptoms. Denies any vaginal symptoms. Does not want to be checked for STDs. Urinalysis showing too numerous to count white blood cells and many bacteria. No evidence of pyelonephritis. She is afebrile, nontoxic appearing. Will discharge home with Macrobid. Follow-up with family doctor as needed. Return precautions discussed.  Vitals:    08/28/17 0852 08/28/17 0853  BP:  115/65  Pulse:  76  Resp:  20  Temp:  98.8 F (37.1 C)  TempSrc:  Oral  SpO2:  100%  Weight: 97.5 kg (215 lb)   Height:  (1.676 m)      Final Clinical Impressions(s) / ED Diagnoses   Final diagnoses:  Acute cystitis without hematuria    New Prescriptions Discharge Medication List as of 08/28/2017  9:54 AM    START taking these medications   Details  nitrofurantoin, macrocrystal-monohydrate, (MACROBID) 100 MG capsule Take 1 capsule (100 mg total) by mouth 2 (two) times daily., Starting Fri 08/28/2017, Print         Machaela Caterino, [place], PA-C 08/28/17 1612    Charlynne Pander, MD 08/29/17 [phone]

## 2017-08-28 NOTE — ED Notes (Signed)
Pt directed to pharmacy to pick up RX 

## 2017-08-28 NOTE — ED Notes (Signed)
ED Provider at bedside. 

## 2018-08-18 ENCOUNTER — Emergency Department (HOSPITAL_BASED_OUTPATIENT_CLINIC_OR_DEPARTMENT_OTHER)
Admission: EM | Admit: 2018-08-18 | Discharge: 2018-08-19 | Disposition: A | Discharge status: Home / Self Care | Attending: Emergency Medicine | Admitting: Emergency Medicine

## 2018-08-18 ENCOUNTER — Other Ambulatory Visit: Payer: Self-pay

## 2018-08-18 ENCOUNTER — Encounter (HOSPITAL_BASED_OUTPATIENT_CLINIC_OR_DEPARTMENT_OTHER): Payer: Self-pay

## 2018-08-18 DIAGNOSIS — J029 Acute pharyngitis, unspecified: Secondary | ICD-10-CM | POA: Diagnosis present

## 2018-08-18 DIAGNOSIS — R350 Frequency of micturition: Secondary | ICD-10-CM | POA: Diagnosis not present

## 2018-08-18 DIAGNOSIS — Z79899 Other long term (current) drug therapy: Secondary | ICD-10-CM | POA: Diagnosis not present

## 2018-08-18 DIAGNOSIS — M545 Low back pain: Secondary | ICD-10-CM | POA: Diagnosis not present

## 2018-08-18 LAB — URINALYSIS, ROUTINE W REFLEX MICROSCOPIC
Bilirubin Urine: NEGATIVE
GLUCOSE, UA: NEGATIVE mg/dL
Ketones, ur: NEGATIVE mg/dL
LEUKOCYTES UA: NEGATIVE
NITRITE: NEGATIVE
PH: 7.5 (ref 5.0–8.0)
Protein, ur: NEGATIVE mg/dL
Specific Gravity, Urine: 1.02 (ref 1.005–1.030)

## 2018-08-18 LAB — URINALYSIS, MICROSCOPIC (REFLEX): WBC, UA: NONE SEEN WBC/hpf (ref 0–5)

## 2018-08-18 LAB — GROUP A STREP BY PCR: GROUP A STREP BY PCR: NOT DETECTED

## 2018-08-18 LAB — PREGNANCY, URINE: Preg Test, Ur: NEGATIVE

## 2018-08-18 NOTE — ED Triage Notes (Addendum)
Pt c/o sore throat x today-NAD-steady gait-also c/o urinary freq x 3 days with lower back pain x 2 weeks-states she has freq UTI

## 2018-08-19 NOTE — ED Provider Notes (Signed)
[facility] Provider Note: Lowella DellJ. Lane Shakiyla Kook, MD, FACEP  CSN: [id number] MRN: [id number] ARRIVAL: 08/18/18 at 2140 ROOM: [facility]   CHIEF COMPLAINT  Sore Throat and Urinary Frequency   HISTORY OF PRESENT ILLNESS  08/19/18 12:13 AM Deborah Acreeaundea Rodriguez is a 32 y.o. female with a 2-week history of lower back pain and a 3-day history of urinary frequency.  Symptoms are similar to previous urinary tract infections.  Her symptoms are not severe.  She is also here with a sore throat since yesterday.  She rates her pain as a 3 out of 10, worse with swallowing.  She has not had a fever.  Her son was recently treated for strep throat.   Past Medical History:  Diagnosis Date  . Anxiety   . Preeclampsia     Past Surgical History:  Procedure Laterality Date  . CESAREAN SECTION    . TONSILLECTOMY      Family History  Problem Relation Age of Onset  . Diabetes Maternal Grandfather   . Stroke Paternal Grandfather     Social History   Tobacco Use  . Smoking status: Never Smoker  . Smokeless tobacco: Never Used  Substance Use Topics  . Alcohol use: Yes    Comment: occ  . Drug use: No    Prior to Admission medications   Medication Sig Start Date End Date Taking? Authorizing Provider  cephALEXin (KEFLEX) 500 MG capsule Take 1 capsule (500 mg total) by mouth 3 (three) times daily. 05/29/17   Azalia Bilisampos, Kevin, MD  cholecalciferol (VITAMIN D) 1000 units tablet Take 1,000 Units by mouth daily.    [provider]  fluticasone (FLONASE) 50 MCG/ACT nasal spray Place 1 spray into both nostrils daily as needed for allergies or rhinitis.    [provider]  Multiple Vitamin (MULTIVITAMIN WITH MINERALS) TABS tablet Take 1 tablet by mouth daily.    [provider]  nitrofurantoin, macrocrystal-monohydrate, (MACROBID) 100 MG capsule Take 1 capsule (100 mg total) by mouth 2 (two) times daily. 08/28/17   Kirichenko, Lemont Fillersatyana, PA-C  norethindrone-ethinyl estradiol (JUNEL  FE,GILDESS FE,LOESTRIN FE) 1-20 MG-MCG tablet Take 1 tablet by mouth at bedtime.    [provider]  norgestimate-ethinyl estradiol (ORTHO-CYCLEN,SPRINTEC,PREVIFEM) 0.25-35 MG-MCG tablet Take 1 tablet by mouth daily.    [provider]    Allergies Patient has no known allergies.   REVIEW OF SYSTEMS  Negative except as noted here or in the History of Present Illness.   PHYSICAL EXAMINATION  Initial Vital Signs Blood pressure 120/78, pulse 82, temperature 98.4 F (36.9 C), temperature source Oral, resp. rate 18, last menstrual period 08/16/2018, SpO2 99 %.  Examination General: Well-developed, well-nourished female in no acute distress; appearance consistent with age of record HENT: normocephalic; atraumatic; no pharyngeal erythema or exudate Eyes: pupils equal, round and reactive to light; extraocular muscles intact Neck: supple; no lymphadenopathy Heart: regular rate and rhythm Lungs: clear to auscultation bilaterally Abdomen: soft; nondistended; nontender; bowel sounds present GU: No CVA tenderness Extremities: No deformity; full range of motion Neurologic: Awake, alert and oriented; motor function intact in all extremities and symmetric; no facial droop Skin: Warm and dry Psychiatric: Normal mood and affect   RESULTS  Summary of this visit's results, reviewed by myself:   EKG Interpretation  Date/Time:    Ventricular Rate:    PR Interval:    QRS Duration:   QT Interval:    QTC Calculation:   R Axis:     Text Interpretation:  Laboratory Studies: Results for orders placed or performed during the hospital encounter of 08/18/18 (from the past 24 hour(s))  Urinalysis, Routine w reflex microscopic- may I&O cath if menses     Status: Abnormal   Collection Time: 08/18/18 10:23 PM  Result Value Ref Range   Color, Urine YELLOW YELLOW   APPearance CLEAR CLEAR   Specific Gravity, Urine 1.020 1.005 - 1.030   pH 7.5 5.0 - 8.0   Glucose, UA  NEGATIVE NEGATIVE mg/dL   Hgb urine dipstick TRACE (A) NEGATIVE   Bilirubin Urine NEGATIVE NEGATIVE   Ketones, ur NEGATIVE NEGATIVE mg/dL   Protein, ur NEGATIVE NEGATIVE mg/dL   Nitrite NEGATIVE NEGATIVE   Leukocytes, UA NEGATIVE NEGATIVE  Pregnancy, urine     Status: None   Collection Time: 08/18/18 10:23 PM  Result Value Ref Range   Preg Test, Ur NEGATIVE NEGATIVE  Urinalysis, Microscopic (reflex)     Status: Abnormal   Collection Time: 08/18/18 10:23 PM  Result Value Ref Range   RBC / HPF 0-5 0 - 5 RBC/hpf   WBC, UA NONE SEEN 0 - 5 WBC/hpf   Bacteria, UA RARE (A) NONE SEEN   Squamous Epithelial / LPF 0-5 0 - 5  Group A Strep by PCR     Status: None   Collection Time: 08/18/18 11:19 PM  Result Value Ref Range   Group A Strep by PCR NOT DETECTED NOT DETECTED   Imaging Studies: No results found.  ED COURSE and MDM  Nursing notes and initial vitals signs, including pulse oximetry, reviewed.  Vitals:   08/18/18 2151  BP: 120/78  Pulse: 82  Resp: 18  Temp: 98.4 F (36.9 C)  TempSrc: Oral  SpO2: 99%   No evidence of urinary tract infection or strep throat.  PROCEDURES    ED DIAGNOSES     ICD-10-CM   1. Urinary frequency R35.0   2. Viral pharyngitis J02.9        Cherri Yera, MD 08/19/18 [phone]

## 2018-08-19 NOTE — ED Notes (Signed)
Pt verbalizes understanding of d/c instructions and denies any further needs at this time. 

## 2018-10-08 ENCOUNTER — Encounter (HOSPITAL_BASED_OUTPATIENT_CLINIC_OR_DEPARTMENT_OTHER): Payer: Self-pay | Admitting: *Deleted

## 2018-10-08 ENCOUNTER — Other Ambulatory Visit: Payer: Self-pay

## 2018-10-08 ENCOUNTER — Emergency Department (HOSPITAL_BASED_OUTPATIENT_CLINIC_OR_DEPARTMENT_OTHER)

## 2018-10-08 ENCOUNTER — Emergency Department (HOSPITAL_BASED_OUTPATIENT_CLINIC_OR_DEPARTMENT_OTHER)
Admission: EM | Admit: 2018-10-08 | Discharge: 2018-10-08 | Disposition: A | Discharge status: Home / Self Care | Attending: Emergency Medicine | Admitting: Emergency Medicine

## 2018-10-08 DIAGNOSIS — O418X10 Other specified disorders of amniotic fluid and membranes, first trimester, not applicable or unspecified: Secondary | ICD-10-CM

## 2018-10-08 DIAGNOSIS — R112 Nausea with vomiting, unspecified: Secondary | ICD-10-CM

## 2018-10-08 DIAGNOSIS — Z79899 Other long term (current) drug therapy: Secondary | ICD-10-CM | POA: Insufficient documentation

## 2018-10-08 DIAGNOSIS — O418X11 Other specified disorders of amniotic fluid and membranes, first trimester, fetus 1: Secondary | ICD-10-CM | POA: Diagnosis not present

## 2018-10-08 DIAGNOSIS — Z3A08 8 weeks gestation of pregnancy: Secondary | ICD-10-CM | POA: Diagnosis not present

## 2018-10-08 DIAGNOSIS — O468X1 Other antepartum hemorrhage, first trimester: Secondary | ICD-10-CM | POA: Insufficient documentation

## 2018-10-08 DIAGNOSIS — O219 Vomiting of pregnancy, unspecified: Secondary | ICD-10-CM | POA: Insufficient documentation

## 2018-10-08 DIAGNOSIS — O034 Incomplete spontaneous abortion without complication: Secondary | ICD-10-CM

## 2018-10-08 LAB — CBC WITH DIFFERENTIAL/PLATELET
Abs Immature Granulocytes: 0.02 10*3/uL (ref 0.00–0.07)
Basophils Absolute: 0 10*3/uL (ref 0.0–0.1)
Basophils Relative: 0 %
EOS ABS: 0.1 10*3/uL (ref 0.0–0.5)
EOS PCT: 1 %
HEMATOCRIT: 32.3 % — AB (ref 36.0–46.0)
Hemoglobin: 9.9 g/dL — ABNORMAL LOW (ref 12.0–15.0)
IMMATURE GRANULOCYTES: 0 %
LYMPHS ABS: 2 10*3/uL (ref 0.7–4.0)
LYMPHS PCT: 32 %
MCH: 24.1 pg — AB (ref 26.0–34.0)
MCHC: 30.7 g/dL (ref 30.0–36.0)
MCV: 78.6 fL — AB (ref 80.0–100.0)
MONO ABS: 0.4 10*3/uL (ref 0.1–1.0)
MONOS PCT: 7 %
NEUTROS ABS: 3.7 10*3/uL (ref 1.7–7.7)
NRBC: 0 % (ref 0.0–0.2)
Neutrophils Relative %: 60 %
Platelets: 220 10*3/uL (ref 150–400)
RBC: 4.11 MIL/uL (ref 3.87–5.11)
RDW: 15 % (ref 11.5–15.5)
WBC: 6.2 10*3/uL (ref 4.0–10.5)

## 2018-10-08 LAB — URINALYSIS, ROUTINE W REFLEX MICROSCOPIC
BILIRUBIN URINE: NEGATIVE
GLUCOSE, UA: NEGATIVE mg/dL
KETONES UR: NEGATIVE mg/dL
LEUKOCYTES UA: NEGATIVE
Nitrite: NEGATIVE
PH: 8 (ref 5.0–8.0)
Protein, ur: NEGATIVE mg/dL
SPECIFIC GRAVITY, URINE: 1.015 (ref 1.005–1.030)

## 2018-10-08 LAB — COMPREHENSIVE METABOLIC PANEL
ALT: 16 U/L (ref 0–44)
AST: 19 U/L (ref 15–41)
Albumin: 3.7 g/dL (ref 3.5–5.0)
Alkaline Phosphatase: 76 U/L (ref 38–126)
Anion gap: 9 (ref 5–15)
BILIRUBIN TOTAL: 0.3 mg/dL (ref 0.3–1.2)
BUN: 7 mg/dL (ref 6–20)
CO2: 26 mmol/L (ref 22–32)
Calcium: 9 mg/dL (ref 8.9–10.3)
Chloride: 100 mmol/L (ref 98–111)
Creatinine, Ser: 0.57 mg/dL (ref 0.44–1.00)
Glucose, Bld: 103 mg/dL — ABNORMAL HIGH (ref 70–99)
POTASSIUM: 3.9 mmol/L (ref 3.5–5.1)
Sodium: 135 mmol/L (ref 135–145)
TOTAL PROTEIN: 7.2 g/dL (ref 6.5–8.1)

## 2018-10-08 LAB — URINALYSIS, MICROSCOPIC (REFLEX)

## 2018-10-08 LAB — PREGNANCY, URINE: Preg Test, Ur: POSITIVE — AB

## 2018-10-08 LAB — LIPASE, BLOOD: LIPASE: 27 U/L (ref 11–51)

## 2018-10-08 LAB — HCG, QUANTITATIVE, PREGNANCY: HCG, BETA CHAIN, QUANT, S: 163536 m[IU]/mL — AB (ref <5)

## 2018-10-08 MED ORDER — ONDANSETRON HCL 4 MG/2ML IJ SOLN
4.0000 mg | Freq: Once | INTRAMUSCULAR | Status: AC
  Administered 2018-10-08: 4 mg via INTRAVENOUS
  Filled 2018-10-08: qty 2

## 2018-10-08 MED ORDER — METOCLOPRAMIDE HCL 10 MG PO TABS
10.0000 mg | ORAL_TABLET | Freq: Three times a day (TID) | ORAL | 0 refills | Status: AC | PRN
Start: 2018-10-08 — End: ?

## 2018-10-08 MED ORDER — SODIUM CHLORIDE 0.9 % IV BOLUS
1000.0000 mL | Freq: Once | INTRAVENOUS | Status: AC
  Administered 2018-10-08: 1000 mL via INTRAVENOUS

## 2018-10-08 NOTE — ED Provider Notes (Signed)
[facility] EMERGENCY DEPARTMENT Provider Note   CSN: [id number] Arrival date & time: 10/08/18  0909     History   Chief Complaint Chief Complaint  Patient presents with  . Emesis    HPI Deborah Rodriguez is a 32 y.o. female.  HPI  32 year old female with past medical history as below including recent delivery in May here with nausea vomiting.  The patient states that she actually became pregnant recently.  She was told she was 7 weeks and she took oral abortion meds 2 to 3 days ago.  Since then, she has passed a decent amount of blood and clots.  She is also had persistent nausea and vomiting.  She was told to come back for evaluation if she had persistent vomiting so she is here today.  She is been able to eat and drink.  She denies any lower abdominal pain or cramping.  She has only light spotting at this time.  No other complaints.  No fevers or chills.  Past Medical History:  Diagnosis Date  . Anxiety   . Preeclampsia     There are no active problems to display for this patient.   Past Surgical History:  Procedure Laterality Date  . CESAREAN SECTION     x 2  . TONSILLECTOMY       OB History    Gravida  9   Para  5   Term  4   Preterm  1   AB  3   Living  5     SAB  1   TAB  2   Ectopic  0   Multiple  0   Live Births  5        Obstetric Comments  c-sect x 2         Home Medications    Prior to Admission medications   Medication Sig Start Date End Date Taking? Authorizing Provider  cholecalciferol (VITAMIN D) 1000 units tablet Take 1,000 Units by mouth daily.    [provider]  fluticasone (FLONASE) 50 MCG/ACT nasal spray Place 1 spray into both nostrils daily as needed for allergies or rhinitis.    [provider]  metoCLOPramide (REGLAN) 10 MG tablet Take 1 tablet (10 mg total) by mouth every 8 (eight) hours as needed for nausea or vomiting. 10/08/18   Shaune Pollack, MD  Multiple Vitamin (MULTIVITAMIN WITH  MINERALS) TABS tablet Take 1 tablet by mouth daily.    [provider]  norethindrone-ethinyl estradiol (JUNEL FE,GILDESS FE,LOESTRIN FE) 1-20 MG-MCG tablet Take 1 tablet by mouth at bedtime.    [provider]  norgestimate-ethinyl estradiol (ORTHO-CYCLEN,SPRINTEC,PREVIFEM) 0.25-35 MG-MCG tablet Take 1 tablet by mouth daily.    [provider]    Family History Family History  Problem Relation Age of Onset  . Diabetes Maternal Grandfather   . Stroke Paternal Grandfather     Social History Social History   Tobacco Use  . Smoking status: Never Smoker  . Smokeless tobacco: Never Used  Substance Use Topics  . Alcohol use: Yes    Comment: occ  . Drug use: No     Allergies   Patient has no known allergies.   Review of Systems Review of Systems  Constitutional: Positive for fatigue. Negative for chills and fever.  HENT: Negative for congestion and rhinorrhea.   Eyes: Negative for visual disturbance.  Respiratory: Negative for cough, shortness of breath and wheezing.   Cardiovascular: Negative for chest pain and leg swelling.  Gastrointestinal: Positive for nausea and vomiting. Negative for abdominal pain and diarrhea.  Genitourinary: Negative for dysuria and flank pain.  Musculoskeletal: Negative for neck pain and neck stiffness.  Skin: Negative for rash and wound.  Allergic/Immunologic: Negative for immunocompromised state.  Neurological: Negative for syncope, weakness and headaches.  All other systems reviewed and are negative.    Physical Exam Updated Vital Signs BP 129/80   Pulse 84   Temp 97.8 F (36.6 C) (Oral)   Resp 17   Ht 5\' 7"  (1.702 m)   Wt 106.6 kg   LMP 08/11/2018 (Exact Date)   SpO2 100%   BMI 36.81 kg/m   Physical Exam  Constitutional: She is oriented to person, place, and time. She appears well-developed and well-nourished. No distress.  HENT:  Head: Normocephalic and atraumatic.  Mouth/Throat: Oropharynx is clear  and moist.  Eyes: Conjunctivae are normal.  Neck: Neck supple.  Cardiovascular: Normal rate, regular rhythm and normal heart sounds. Exam reveals no friction rub.  No murmur heard. Pulmonary/Chest: Effort normal and breath sounds normal. No respiratory distress. She has no wheezes. She has no rales.  Abdominal: Soft. Bowel sounds are normal. She exhibits no distension. There is no tenderness. There is no guarding. No hernia.  Genitourinary:  Genitourinary Comments: No uterine or adnexal pain or tenderness.  No bleeding  Musculoskeletal: She exhibits no edema.  Neurological: She is alert and oriented to person, place, and time. She exhibits normal muscle tone.  Skin: Skin is warm. Capillary refill takes less than 2 seconds.  Psychiatric: She has a normal mood and affect.  Nursing note and vitals reviewed.    ED Treatments / Results  Labs (all labs ordered are listed, but only abnormal results are displayed) Labs Reviewed  URINALYSIS, ROUTINE W REFLEX MICROSCOPIC - Abnormal; Notable for the following components:      Result Value   Hgb urine dipstick MODERATE (*)    All other components within normal limits  PREGNANCY, URINE - Abnormal; Notable for the following components:   Preg Test, Ur POSITIVE (*)    All other components within normal limits  CBC WITH DIFFERENTIAL/PLATELET - Abnormal; Notable for the following components:   Hemoglobin 9.9 (*)    HCT 32.3 (*)    MCV 78.6 (*)    MCH 24.1 (*)    All other components within normal limits  COMPREHENSIVE METABOLIC PANEL - Abnormal; Notable for the following components:   Glucose, Bld 103 (*)    All other components within normal limits  HCG, QUANTITATIVE, PREGNANCY - Abnormal; Notable for the following components:   hCG, Beta Chain, Quant, S [id number] (*)    All other components within normal limits  URINALYSIS, MICROSCOPIC (REFLEX) - Abnormal; Notable for the following components:   Bacteria, UA FEW (*)    All other components  within normal limits  LIPASE, BLOOD    EKG None  Radiology US Ob Comp Less 14 Wks  Result Date: 10/08/2018 CLINICAL DATA:  Recent medical abortion 3-4 days ago currently with vaginal spotting. Evaluate for retained products of conception. Positive urine pregnancy test with quantitative beta HCG pending. EXAM: OBSTETRIC <14 WK [place] AND TRANSVAGINAL OB US TECHNIQUE: Both transabdominal and transvaginal ultrasound examinations were performed for complete evaluation of the gestation as well as the maternal uterus, adnexal regions, and pelvic cul-de-sac. Transvaginal technique was performed to assess early pregnancy. COMPARISON:  None. FINDINGS: Intrauterine gestational sac: Single visualized. Yolk sac:  Visualized. Embryo:  Visualized. Cardiac Activity: Visualized. Heart Rate:  171 bpm CRL: 16.6 mm   8 w   1 d                  [place] EDC: 05/19/2019 Subchorionic hemorrhage:  Small subchronic hemorrhage visualized. Maternal uterus/adnexae: Ovaries are normal size, shape and position with probable corpus luteum over the right ovary. Trace free pelvic fluid. IMPRESSION: Single live IUP with estimated gestational age [redacted] weeks 1 day. Electronically Signed   By: Elberta Fortis M.D.   On: 10/08/2018 12:25   US Ob Transvaginal  Result Date: 10/08/2018 CLINICAL DATA:  Recent medical abortion 3-4 days ago currently with vaginal spotting. Evaluate for retained products of conception. Positive urine pregnancy test with quantitative beta HCG pending. EXAM: OBSTETRIC <14 WK [place] AND TRANSVAGINAL OB US TECHNIQUE: Both transabdominal and transvaginal ultrasound examinations were performed for complete evaluation of the gestation as well as the maternal uterus, adnexal regions, and pelvic cul-de-sac. Transvaginal technique was performed to assess early pregnancy. COMPARISON:  None. FINDINGS: Intrauterine gestational sac: Single visualized. Yolk sac:  Visualized. Embryo:  Visualized. Cardiac Activity: Visualized. Heart Rate: 171 bpm  CRL: 16.6 mm   8 w   1 d                  [place] EDC: 05/19/2019 Subchorionic hemorrhage:  Small subchronic hemorrhage visualized. Maternal uterus/adnexae: Ovaries are normal size, shape and position with probable corpus luteum over the right ovary. Trace free pelvic fluid. IMPRESSION: Single live IUP with estimated gestational age [redacted] weeks 1 day. Electronically Signed   By: Elberta Fortis M.D.   On: 10/08/2018 12:25    Procedures Procedures (including critical care time)  Medications Ordered in ED Medications  sodium chloride 0.9 % bolus 1,000 mL ( Intravenous Stopped 10/08/18 1133)  ondansetron (ZOFRAN) injection 4 mg (4 mg Intravenous Given 10/08/18 1027)     Initial Impression / Assessment and Plan / ED Course  I have reviewed the triage vital signs and the nursing notes.  Pertinent labs & imaging results that were available during my care of the patient were reviewed by me and considered in my medical decision making (see chart for details).     32 year old female here with nausea and vomiting after oral chemical abortion 3 days ago.  Imaging today shows small subchorionic hemorrhage but persistent, viable intrauterine pregnancy.  I suspect this plus side effects of the misoprostol explain her nausea.  Her lab work is otherwise reassuring.  No urinary symptoms.  I discussed the case with OB on-call, who recommends patient follow-up with her OB/GYN for repeat treatment if still desired.  No emergent intervention needed at this time per OB.  Will treat her nausea, and refer.  I had a very long discussion with her regarding today's ultrasound results, including diagnosis of continued viable intrauterine pregnancy.  Final Clinical Impressions(s) / ED Diagnoses   Final diagnoses:  Subchorionic hemorrhage of placenta in first trimester, single or unspecified fetus  Non-intractable vomiting with nausea, unspecified vomiting type    ED Discharge Orders         Ordered    metoCLOPramide (REGLAN)  10 MG tablet  Every 8 hours PRN     10/08/18 1331           Shaune Pollack, MD 10/08/18 2116

## 2018-10-08 NOTE — Discharge Instructions (Addendum)
As we discussed, your ultrasound today shows a live pregnancy at 8 weeks, 1 day. If you still desire an abortion, it is critical for you to follow-up with your OB, [facility], or [facility] to discuss additional options  Take the medication as prescribed

## 2018-10-08 NOTE — ED Triage Notes (Signed)
Pt reports she was [redacted] weeks pregnant and was given "abortion pill" in her gyn office on Tuesday, given rx for 4 additional pills to insert vaginally when she got home. Pt reports some mild cramping and spotting since then, started vomiting yesterday and is unable to retain po.

## 2018-10-08 NOTE — ED Notes (Signed)
Patient transported to Ultrasound 

## 2018-10-28 ENCOUNTER — Emergency Department (HOSPITAL_BASED_OUTPATIENT_CLINIC_OR_DEPARTMENT_OTHER)
Admission: EM | Admit: 2018-10-28 | Discharge: 2018-10-29 | Disposition: A | Discharge status: Home / Self Care | Attending: Emergency Medicine | Admitting: Emergency Medicine

## 2018-10-28 ENCOUNTER — Encounter (HOSPITAL_BASED_OUTPATIENT_CLINIC_OR_DEPARTMENT_OTHER): Payer: Self-pay

## 2018-10-28 ENCOUNTER — Other Ambulatory Visit: Payer: Self-pay

## 2018-10-28 DIAGNOSIS — R82998 Other abnormal findings in urine: Secondary | ICD-10-CM | POA: Diagnosis present

## 2018-10-28 DIAGNOSIS — N938 Other specified abnormal uterine and vaginal bleeding: Secondary | ICD-10-CM | POA: Diagnosis not present

## 2018-10-28 DIAGNOSIS — N390 Urinary tract infection, site not specified: Secondary | ICD-10-CM | POA: Diagnosis not present

## 2018-10-28 DIAGNOSIS — Z79899 Other long term (current) drug therapy: Secondary | ICD-10-CM | POA: Diagnosis not present

## 2018-10-28 DIAGNOSIS — R350 Frequency of micturition: Secondary | ICD-10-CM | POA: Diagnosis not present

## 2018-10-28 DIAGNOSIS — R309 Painful micturition, unspecified: Secondary | ICD-10-CM | POA: Diagnosis not present

## 2018-10-28 LAB — URINALYSIS, ROUTINE W REFLEX MICROSCOPIC
Bilirubin Urine: NEGATIVE
Glucose, UA: NEGATIVE mg/dL
Hgb urine dipstick: NEGATIVE
Ketones, ur: 15 mg/dL — AB
NITRITE: NEGATIVE
Protein, ur: NEGATIVE mg/dL
SPECIFIC GRAVITY, URINE: 1.025 (ref 1.005–1.030)
pH: 6 (ref 5.0–8.0)

## 2018-10-28 LAB — URINALYSIS, MICROSCOPIC (REFLEX)

## 2018-10-28 LAB — PREGNANCY, URINE: PREG TEST UR: POSITIVE — AB

## 2018-10-28 NOTE — ED Provider Notes (Signed)
[facility] Provider Note: [name], MD, FACEP  CSN: [id number] MRN: [id number] ARRIVAL: 10/28/18 at 2219 ROOM: MH02/MH02   CHIEF COMPLAINT  Urinary Frequency   HISTORY OF PRESENT ILLNESS  10/28/18 11:13 PM Deborah Rodriguez is a 32 y.o. female who recently had an elective abortion.  Her last quantitative hCG on October 18 with [phone].  She is here with a 1 day history of urinary tract infection symptoms.  Specifically yesterday she noticed an ammonia odor to her urine.  Today she developed urinary frequency and mild burning at the end of her voiding.  She has not had a fever or chills.  She does continue to have some mild vaginal bleeding.  She denies abdominal pain.   Past Medical History:  Diagnosis Date  . Anxiety   . Preeclampsia     Past Surgical History:  Procedure Laterality Date  . CESAREAN SECTION     x 2  . TONSILLECTOMY      Family History  Problem Relation Age of Onset  . Diabetes Maternal Grandfather   . Stroke Paternal Grandfather     Social History   Tobacco Use  . Smoking status: Never Smoker  . Smokeless tobacco: Never Used  Substance Use Topics  . Alcohol use: Yes    Comment: occ  . Drug use: No    Prior to Admission medications   Medication Sig Start Date End Date Taking? Authorizing Provider  cholecalciferol (VITAMIN D) 1000 units tablet Take 1,000 Units by mouth daily.   Yes [provider]  etonogestrel (NEXPLANON) 68 MG IMPL implant 1 each by Subdermal route once.   Yes [provider]  fluticasone (FLONASE) 50 MCG/ACT nasal spray Place 1 spray into both nostrils daily as needed for allergies or rhinitis.   Yes [provider]  metoCLOPramide (REGLAN) 10 MG tablet Take 1 tablet (10 mg total) by mouth every 8 (eight) hours as needed for nausea or vomiting. 10/08/18  Yes Shaune Pollack, MD  Multiple Vitamin (MULTIVITAMIN WITH MINERALS) TABS tablet Take 1 tablet by mouth daily.   Yes [provider]  norethindrone-ethinyl estradiol (JUNEL FE,GILDESS FE,LOESTRIN FE) 1-20 MG-MCG tablet Take 1 tablet by mouth at bedtime.   Yes [provider]  norgestimate-ethinyl estradiol (ORTHO-CYCLEN,SPRINTEC,PREVIFEM) 0.25-35 MG-MCG tablet Take 1 tablet by mouth daily.   Yes [provider]  UNABLE TO FIND Take by mouth 1 day or 1 dose. Iron supplement   Yes [provider]    Allergies Patient has no known allergies.   REVIEW OF SYSTEMS  Negative except as noted here or in the History of Present Illness.   PHYSICAL EXAMINATION  Initial Vital Signs Blood pressure 125/73, pulse 89, temperature 98.5 F (36.9 C), temperature source Oral, resp. rate 18, height 5\' 7"  (1.702 m), weight 106.6 kg, last menstrual period 10/07/2018, SpO2 97 %.  Examination General: Well-developed, well-nourished female in no acute distress; appearance consistent with age of record HENT: normocephalic; atraumatic Eyes: Normal appearance Neck: supple Heart: regular rate and rhythm Lungs: clear to auscultation bilaterally Abdomen: soft; nondistended; nontender; bowel sounds present GU: No CVA tenderness Extremities: No deformity; full range of motion Neurologic: Awake, alert and oriented; motor function intact in all extremities and symmetric; no facial droop Skin: Warm and dry Psychiatric: Normal mood and affect   RESULTS  Summary of this visit's results, reviewed by myself:   EKG Interpretation  Date/Time:    Ventricular Rate:    PR Interval:    QRS  Duration:   QT Interval:    QTC Calculation:   R Axis:     Text Interpretation:        Laboratory Studies: Results for orders placed or performed during the hospital encounter of 10/28/18 (from the past 24 hour(s))  Urinalysis, Routine w reflex microscopic     Status: Abnormal   Collection Time: 10/28/18 10:29 PM  Result Value Ref Range   Color, Urine YELLOW YELLOW   APPearance HAZY (A) CLEAR   Specific  Gravity, Urine 1.025 1.005 - 1.030   pH 6.0 5.0 - 8.0   Glucose, UA NEGATIVE NEGATIVE mg/dL   Hgb urine dipstick NEGATIVE NEGATIVE   Bilirubin Urine NEGATIVE NEGATIVE   Ketones, ur 15 (A) NEGATIVE mg/dL   Protein, ur NEGATIVE NEGATIVE mg/dL   Nitrite NEGATIVE NEGATIVE   Leukocytes, UA TRACE (A) NEGATIVE  Pregnancy, urine     Status: Abnormal   Collection Time: 10/28/18 10:29 PM  Result Value Ref Range   Preg Test, Ur POSITIVE (A) NEGATIVE  Urinalysis, Microscopic (reflex)     Status: Abnormal   Collection Time: 10/28/18 10:29 PM  Result Value Ref Range   RBC / HPF 0-5 0 - 5 RBC/hpf   WBC, UA 11-20 0 - 5 WBC/hpf   Bacteria, UA MANY (A) NONE SEEN   Squamous Epithelial / LPF 0-5 0 - 5   Mucus PRESENT   hCG, quantitative, pregnancy     Status: Abnormal   Collection Time: 10/28/18 11:22 PM  Result Value Ref Range   hCG, Beta Chain, Quant, S 75 (H) <5 mIU/mL   Imaging Studies: No results found.  ED COURSE and MDM  Nursing notes and initial vitals signs, including pulse oximetry, reviewed.  Vitals:   10/28/18 2224 10/28/18 2226  BP:  125/73  Pulse:  89  Resp:  18  Temp:  98.5 F (36.9 C)  TempSrc:  Oral  SpO2:  97%  Weight: 106.6 kg   Height: 5\' 7"  (1.702 m)    Urinalysis consistent with urinary tract infection.  Quant beta hCG appropriately low status post elective abortion.  PROCEDURES    ED DIAGNOSES     ICD-10-CM   1. Lower urinary tract infectious disease N39.0        Kaden Dunkel, MD 10/29/18 1610

## 2018-10-28 NOTE — ED Triage Notes (Signed)
Pt c/o urinary frequency and burning. Denies fevers

## 2018-10-29 LAB — HCG, QUANTITATIVE, PREGNANCY: HCG, BETA CHAIN, QUANT, S: 75 m[IU]/mL — AB (ref <5)

## 2018-10-29 MED ORDER — CEPHALEXIN 250 MG PO CAPS
500.0000 mg | ORAL_CAPSULE | Freq: Once | ORAL | Status: AC
  Administered 2018-10-29: 500 mg via ORAL
  Filled 2018-10-29: qty 2

## 2018-10-29 MED ORDER — CEPHALEXIN 500 MG PO CAPS: 500.0000 mg | ORAL_CAPSULE | Freq: Two times a day (BID) | ORAL | 0 refills | Status: AC

## 2018-10-31 LAB — URINE CULTURE: Culture: >=100000 — AB

## 2018-11-01 ENCOUNTER — Telehealth: Payer: Self-pay | Admitting: Emergency Medicine

## 2018-11-01 NOTE — Telephone Encounter (Signed)
Post ED Visit - Positive Culture Follow-up  Culture report reviewed by antimicrobial stewardship pharmacist:  []  Enzo Bi, Pharm.D. []  Celedonio Miyamoto, Pharm.D., BCPS AQ-ID []  Garvin Fila, Pharm.D., BCPS []  Georgina Pillion, Pharm.D., BCPS []  [place], [address].D., BCPS, AAHIVP []  Estella Husk, Pharm.D., BCPS, AAHIVP []  Lysle Pearl, PharmD, BCPS []  Phillips Climes, PharmD, BCPS []  Agapito Games, PharmD, BCPS []  Verlan Friends, PharmD [facility] PharmD  Positive urine culture Treated with cephalexin, organism sensitive to the same and no further patient follow-up is required at this time.  Berle Mull 11/01/2018, 1:09 PM

## 2019-02-02 ENCOUNTER — Emergency Department (HOSPITAL_BASED_OUTPATIENT_CLINIC_OR_DEPARTMENT_OTHER)
Admission: EM | Admit: 2019-02-02 | Discharge: 2019-02-03 | Disposition: A | Discharge status: Home / Self Care | Attending: Emergency Medicine | Admitting: Emergency Medicine

## 2019-02-02 ENCOUNTER — Encounter (HOSPITAL_BASED_OUTPATIENT_CLINIC_OR_DEPARTMENT_OTHER): Payer: Self-pay | Admitting: Emergency Medicine

## 2019-02-02 ENCOUNTER — Other Ambulatory Visit: Payer: Self-pay

## 2019-02-02 DIAGNOSIS — Z711 Person with feared health complaint in whom no diagnosis is made: Secondary | ICD-10-CM | POA: Diagnosis not present

## 2019-02-02 DIAGNOSIS — N898 Other specified noninflammatory disorders of vagina: Secondary | ICD-10-CM

## 2019-02-02 DIAGNOSIS — B9689 Other specified bacterial agents as the cause of diseases classified elsewhere: Secondary | ICD-10-CM

## 2019-02-02 DIAGNOSIS — N760 Acute vaginitis: Secondary | ICD-10-CM | POA: Insufficient documentation

## 2019-02-02 LAB — PREGNANCY, URINE: Preg Test, Ur: NEGATIVE

## 2019-02-02 LAB — URINALYSIS, ROUTINE W REFLEX MICROSCOPIC
Bilirubin Urine: NEGATIVE
Glucose, UA: NEGATIVE mg/dL
Hgb urine dipstick: NEGATIVE
KETONES UR: NEGATIVE mg/dL
LEUKOCYTE UA: NEGATIVE
NITRITE: NEGATIVE
PROTEIN: NEGATIVE mg/dL
Specific Gravity, Urine: >1.03 — ABNORMAL HIGH (ref 1.005–1.030)
pH: 6 (ref 5.0–8.0)

## 2019-02-02 LAB — WET PREP, GENITAL
Sperm: NONE SEEN
TRICH WET PREP: NONE SEEN
YEAST WET PREP: NONE SEEN

## 2019-02-02 MED ORDER — CEFTRIAXONE SODIUM 250 MG IJ SOLR
250.0000 mg | Freq: Once | INTRAMUSCULAR | Status: AC
  Administered 2019-02-02: 250 mg via INTRAMUSCULAR
  Filled 2019-02-02: qty 250

## 2019-02-02 MED ORDER — AZITHROMYCIN 250 MG PO TABS
1000.0000 mg | ORAL_TABLET | Freq: Once | ORAL | Status: AC
  Administered 2019-02-02: 1000 mg via ORAL
  Filled 2019-02-02: qty 4

## 2019-02-02 MED ORDER — LIDOCAINE HCL (PF) 1 % IJ SOLN
INTRAMUSCULAR | Status: AC
  Filled 2019-02-02: qty 5

## 2019-02-02 MED ORDER — METRONIDAZOLE 500 MG PO TABS: 500.0000 mg | ORAL_TABLET | Freq: Two times a day (BID) | ORAL | 0 refills | Status: AC

## 2019-02-02 NOTE — ED Triage Notes (Signed)
Pt states she is having vaginal discharge, white in color, itching and an odor  Sxs started 2 days ago

## 2019-02-02 NOTE — ED Provider Notes (Signed)
Emergency Department Provider Note   I have reviewed the triage vital signs and the nursing notes.   HISTORY  Chief Complaint Vaginal Discharge   HPI Deborah Rodriguez is a 33 y.o. female with PMH of anxiety presents to the emergency department with concern for vaginal discharge.  Symptoms have been ongoing for the past several days.  Patient notes a foul odor and is concerned about possible STD exposure.  She recently broke up with her partner and has concerns regarding infidelity.  She has not been told about any known STD exposure by this person.  No vaginal bleeding.  Patient is not experiencing abdominal or back pain. No radiation of symptoms or modifying factors.   Past Medical History:  Diagnosis Date  . Anxiety   . Preeclampsia     There are no active problems to display for this patient.   Past Surgical History:  Procedure Laterality Date  . CESAREAN SECTION     x 2  . TONSILLECTOMY     Allergies Patient has no known allergies.  Family History  Problem Relation Age of Onset  . Diabetes Maternal Grandfather   . Stroke Paternal Grandfather     Social History Social History   Tobacco Use  . Smoking status: Never Smoker  . Smokeless tobacco: Never Used  Substance Use Topics  . Alcohol use: Yes    Comment: occ  . Drug use: No    Review of Systems  Constitutional: No fever/chills Gastrointestinal: No abdominal pain.  No nausea, no vomiting.  No diarrhea.  No constipation. Genitourinary: Negative for dysuria. Positive vaginal discharge and odor.  Musculoskeletal: Negative for back pain. Skin: Negative for rash. Neurological: Negative for headaches, focal weakness or numbness.  10-point ROS otherwise negative.  ____________________________________________   PHYSICAL EXAM:  VITAL SIGNS: ED Triage Vitals  Enc Vitals Group     BP 02/02/19 2205 119/68     Pulse Rate 02/02/19 2205 76     Resp 02/02/19 2205 16     Temp 02/02/19 2205 98.1 F (36.7  C)     Temp Source 02/02/19 2205 Oral     SpO2 02/02/19 2205 98 %     Weight 02/02/19 2204 215 lb (97.5 kg)     Height 02/02/19 2204 5\' 7"  (1.702 m)     Pain Score 02/02/19 2204 5   Constitutional: Alert and oriented. Well appearing and in no acute distress. Eyes: Conjunctivae are normal. Head: Atraumatic. Nose: No congestion/rhinnorhea. Mouth/Throat: Mucous membranes are moist.  Neck: No stridor.   Cardiovascular: Normal rate, regular rhythm. Good peripheral circulation. Grossly normal heart sounds.   Respiratory: Normal respiratory effort.  No retractions. Lungs CTAB. Gastrointestinal: Soft and nontender. No distention.  Genitourinary: Pelvic performed with nurse chaperone. Mild vaginal discharge. Cervix is slightly friable. No lesions. No CMT or adnexal tenderness/masses.  Musculoskeletal: No gross deformities of extremities. Neurologic:  Normal speech and language. Skin:  Skin is warm, dry and intact. No rash noted.   ____________________________________________   LABS (all labs ordered are listed, but only abnormal results are displayed)  Labs Reviewed  WET PREP, GENITAL - Abnormal; Notable for the following components:      Result Value   Clue Cells Wet Prep HPF POC PRESENT (*)    WBC, Wet Prep HPF POC MODERATE (*)    All other components within normal limits  URINALYSIS, ROUTINE W REFLEX MICROSCOPIC - Abnormal; Notable for the following components:   Specific Gravity, Urine >1.030 (*)  All other components within normal limits  PREGNANCY, URINE  HIV ANTIBODY (ROUTINE TESTING W REFLEX)  RPR  GC/CHLAMYDIA PROBE AMP ([place]   ____________________________________________   PROCEDURES  Procedure(s) performed:   Procedures  None ____________________________________________   INITIAL IMPRESSION / ASSESSMENT AND PLAN / ED COURSE  Pertinent labs & imaging results that were available during my care of the patient were reviewed by me and  considered in my medical decision making (see chart for details).  Patient presents to the emergency department for evaluation of vaginal discharge and concern for possible STD exposure.  Pelvic exam shows a friable cervix with mild discharge.  Wet prep shows no trichomonas.  Patient does have bacterial ptosis which was treated.  Offered empiric treatment for gonorrhea and chlamydia which was done. HIV and RPR sent. No concern for PID or TOA.   ____________________________________________  FINAL CLINICAL IMPRESSION(S) / ED DIAGNOSES  Final diagnoses:  Vaginal discharge  BV (bacterial vaginosis)  Concern about STD in female without diagnosis     MEDICATIONS GIVEN DURING THIS VISIT:  Medications  azithromycin (ZITHROMAX) tablet 1,000 mg (1,000 mg Oral Given 02/02/19 2352)  cefTRIAXone (ROCEPHIN) injection 250 mg (250 mg Intramuscular Given 02/02/19 2352)     NEW OUTPATIENT MEDICATIONS STARTED DURING THIS VISIT:  Discharge Medication List as of 02/03/2019 12:00 AM    START taking these medications   Details  metroNIDAZOLE (FLAGYL) 500 MG tablet Take 1 tablet (500 mg total) by mouth 2 (two) times daily for 7 days., Starting Wed 02/02/2019, Until Wed 02/09/2019, Print        Note:  This document was prepared using Dragon voice recognition software and may include unintentional dictation errors.  Alona BeneJoshua Carlissa Pesola, MD Emergency Medicine    Shakyia Bosso, Arlyss RepressJoshua G, MD 02/03/19 1006

## 2019-02-03 NOTE — Discharge Instructions (Signed)
You were seen in the ED today with vaginal discharge. Your exam showed BV and we are treating you with antibiotics. Do not drink alcohol while taking this. You have also been treated for sexually transmitted infections. Check on My Chart for the results.

## 2019-02-03 NOTE — ED Notes (Signed)
PT states understanding of care given, follow up care, and medication prescribed. PT ambulated from ED to car with a steady gait. 

## 2019-02-04 LAB — GC/CHLAMYDIA PROBE AMP (~~LOC~~) NOT AT ARMC
Chlamydia: NEGATIVE
Neisseria Gonorrhea: NEGATIVE

## 2019-02-04 LAB — HIV ANTIBODY (ROUTINE TESTING W REFLEX): HIV Screen 4th Generation wRfx: NONREACTIVE

## 2019-02-04 LAB — RPR: RPR: NONREACTIVE

## 2019-03-11 ENCOUNTER — Emergency Department (HOSPITAL_BASED_OUTPATIENT_CLINIC_OR_DEPARTMENT_OTHER)

## 2019-03-11 ENCOUNTER — Emergency Department (HOSPITAL_BASED_OUTPATIENT_CLINIC_OR_DEPARTMENT_OTHER)
Admission: EM | Admit: 2019-03-11 | Discharge: 2019-03-11 | Disposition: A | Discharge status: Home / Self Care | Attending: Emergency Medicine | Admitting: Emergency Medicine

## 2019-03-11 ENCOUNTER — Other Ambulatory Visit: Payer: Self-pay

## 2019-03-11 ENCOUNTER — Encounter (HOSPITAL_BASED_OUTPATIENT_CLINIC_OR_DEPARTMENT_OTHER): Payer: Self-pay | Admitting: *Deleted

## 2019-03-11 DIAGNOSIS — R911 Solitary pulmonary nodule: Secondary | ICD-10-CM | POA: Insufficient documentation

## 2019-03-11 DIAGNOSIS — Z79899 Other long term (current) drug therapy: Secondary | ICD-10-CM | POA: Insufficient documentation

## 2019-03-11 DIAGNOSIS — R0602 Shortness of breath: Secondary | ICD-10-CM | POA: Diagnosis present

## 2019-03-11 DIAGNOSIS — N63 Unspecified lump in unspecified breast: Secondary | ICD-10-CM

## 2019-03-11 LAB — CBC WITH DIFFERENTIAL/PLATELET
Abs Immature Granulocytes: 0.01 10*3/uL (ref 0.00–0.07)
BASOS PCT: 0 %
Basophils Absolute: 0 10*3/uL (ref 0.0–0.1)
EOS ABS: 0.1 10*3/uL (ref 0.0–0.5)
EOS PCT: 1 %
HCT: 35.8 % — ABNORMAL LOW (ref 36.0–46.0)
Hemoglobin: 11 g/dL — ABNORMAL LOW (ref 12.0–15.0)
Immature Granulocytes: 0 %
Lymphocytes Relative: 50 %
Lymphs Abs: 2.8 10*3/uL (ref 0.7–4.0)
MCH: 23.8 pg — AB (ref 26.0–34.0)
MCHC: 30.7 g/dL (ref 30.0–36.0)
MCV: 77.3 fL — ABNORMAL LOW (ref 80.0–100.0)
MONO ABS: 0.3 10*3/uL (ref 0.1–1.0)
Monocytes Relative: 6 %
NEUTROS ABS: 2.4 10*3/uL (ref 1.7–7.7)
Neutrophils Relative %: 43 %
PLATELETS: 298 10*3/uL (ref 150–400)
RBC: 4.63 MIL/uL (ref 3.87–5.11)
RDW: 15.9 % — AB (ref 11.5–15.5)
WBC: 5.6 10*3/uL (ref 4.0–10.5)
nRBC: 0 % (ref 0.0–0.2)

## 2019-03-11 LAB — PREGNANCY, URINE: PREG TEST UR: NEGATIVE

## 2019-03-11 LAB — BASIC METABOLIC PANEL
Anion gap: 7 (ref 5–15)
BUN: 10 mg/dL (ref 6–20)
CALCIUM: 8.7 mg/dL — AB (ref 8.9–10.3)
CO2: 25 mmol/L (ref 22–32)
CREATININE: 0.62 mg/dL (ref 0.44–1.00)
Chloride: 104 mmol/L (ref 98–111)
GFR calc Af Amer: >60 mL/min (ref >60)
Glucose, Bld: 88 mg/dL (ref 70–99)
POTASSIUM: 3.3 mmol/L — AB (ref 3.5–5.1)
SODIUM: 136 mmol/L (ref 135–145)

## 2019-03-11 LAB — D-DIMER, QUANTITATIVE: D-Dimer, Quant: 0.6 ug/mL-FEU — ABNORMAL HIGH (ref 0.00–0.50)

## 2019-03-11 LAB — TROPONIN I

## 2019-03-11 MED ORDER — IOPAMIDOL (ISOVUE-370) INJECTION 76%
100.0000 mL | Freq: Once | INTRAVENOUS | Status: AC | PRN
  Administered 2019-03-11: 83 mL via INTRAVENOUS

## 2019-03-11 MED ORDER — ALBUTEROL SULFATE HFA 108 (90 BASE) MCG/ACT IN AERS
1.0000 | INHALATION_SPRAY | Freq: Once | RESPIRATORY_TRACT | Status: AC
  Administered 2019-03-11: 1 via RESPIRATORY_TRACT
  Filled 2019-03-11: qty 6.7

## 2019-03-11 NOTE — ED Provider Notes (Signed)
[facility] EMERGENCY DEPARTMENT Provider Note   CSN: [id number] Arrival date & time: 03/11/19  1503    History   Chief Complaint No chief complaint on file.   HPI Deborah Rodriguez is a 33 y.o. female with PMH/o anxiety, preeclampsia who presents for evaluation for constant SOB and chest heaviness that has been ongoing since yesterday. Patient reports that she was at home watching a movie yesterday afternoon when she first noticed the symptoms. She feels as if she is breathing heavier and she has to take more breaths. She also reports associated chest heaviness that has been constant since yesterday. She does report that she had some diaphoresis initially but denies any nausea/vomiting. She states that the symptoms are not worsened by exertion or deep inspiration. She feels as if the SOB is worse when she is sitting down. She does report that prior to onset of symptoms she had been spray painting her tub without a mask on. She does not have any history of asthma. She denies any smoking, cocaine use, IV drug use. No personal cardiac, HTN, or DM history. She reports aunt had an aneurysm but otherwise no other family cardiac history and no history of MI before the age 27. She does have a Nexplanon but denies any OCP use, recent immobilization, prior history of DVT/PE, recent surgery, leg swelling, or long travel. Patient denies any fever, cough, nasal congestion, rhinorrhea, abdominal pain, nausea/vomiting, leg swelling. She has not had any recent travel and denies any known exposure to persons with COVID-19.        Past Medical History:  Diagnosis Date  . Anxiety   . Preeclampsia     There are no active problems to display for this patient.   Past Surgical History:  Procedure Laterality Date  . CESAREAN SECTION     x 2  . TONSILLECTOMY       OB History    Gravida  9   Para  5   Term  4   Preterm  1   AB  3   Living  5     SAB  1   TAB  2   Ectopic  0   Multiple  0   Live Births  5        Obstetric Comments  c-sect x 2         Home Medications    Prior to Admission medications   Medication Sig Start Date End Date Taking? Authorizing Provider  etonogestrel (NEXPLANON) 68 MG IMPL implant 1 each by Subdermal route once.   Yes [provider]  cephALEXin (KEFLEX) 500 MG capsule Take 1 capsule (500 mg total) by mouth 2 (two) times daily. 10/29/18   Molpus, John, MD  cholecalciferol (VITAMIN D) 1000 units tablet Take 1,000 Units by mouth daily.    [provider]  fluticasone (FLONASE) 50 MCG/ACT nasal spray Place 1 spray into both nostrils daily as needed for allergies or rhinitis.    [provider]  metoCLOPramide (REGLAN) 10 MG tablet Take 1 tablet (10 mg total) by mouth every 8 (eight) hours as needed for nausea or vomiting. 10/08/18   Shaune Pollack, MD  Multiple Vitamin (MULTIVITAMIN WITH MINERALS) TABS tablet Take 1 tablet by mouth daily.    [provider]  norethindrone-ethinyl estradiol (JUNEL FE,GILDESS FE,LOESTRIN FE) 1-20 MG-MCG tablet Take 1 tablet by mouth at bedtime.    [provider]  norgestimate-ethinyl estradiol (ORTHO-CYCLEN,SPRINTEC,PREVIFEM) 0.25-35 MG-MCG tablet Take 1 tablet by mouth  daily.    [provider]  UNABLE TO FIND Take by mouth 1 day or 1 dose. Iron supplement    [provider]    Family History Family History  Problem Relation Age of Onset  . Diabetes Maternal Grandfather   . Stroke Paternal Grandfather     Social History Social History   Tobacco Use  . Smoking status: Never Smoker  . Smokeless tobacco: Never Used  Substance Use Topics  . Alcohol use: Yes    Comment: occ  . Drug use: No     Allergies   Patient has no known allergies.   Review of Systems Review of Systems  Constitutional: Negative for fever.  Respiratory: Positive for shortness of breath. Negative for cough.   Cardiovascular: Positive for chest  pain. Negative for leg swelling.  Gastrointestinal: Negative for abdominal pain, nausea and vomiting.  Genitourinary: Negative for dysuria and hematuria.  Neurological: Negative for headaches.  All other systems reviewed and are negative.    Physical Exam Updated Vital Signs BP 139/79 (BP Location: Right Arm)   Pulse 75   Temp 98.4 F (36.9 C) (Oral)   Resp 16   Ht 5\' 6"  (1.676 m)   Wt 97.5 kg   SpO2 99%   BMI 34.70 kg/m   Physical Exam Vitals signs and nursing note reviewed.  Constitutional:      Appearance: Normal appearance. She is well-developed.  HENT:     Head: Normocephalic and atraumatic.  Eyes:     General: Lids are normal.     Conjunctiva/sclera: Conjunctivae normal.     Pupils: Pupils are equal, round, and reactive to light.  Neck:     Musculoskeletal: Full passive range of motion without pain.  Cardiovascular:     Rate and Rhythm: Normal rate and regular rhythm.     Pulses: Normal pulses.          Radial pulses are 2+ on the right side and 2+ on the left side.       Dorsalis pedis pulses are 2+ on the right side and 2+ on the left side.     Heart sounds: Normal heart sounds. No murmur. No friction rub. No gallop.   Pulmonary:     Effort: Pulmonary effort is normal.     Breath sounds: Normal breath sounds.     Comments: Lungs clear to auscultation bilaterally.  Symmetric chest rise.  No wheezing, rales, rhonchi. Able to speak in full sentences.  Abdominal:     Palpations: Abdomen is soft. Abdomen is not rigid.     Tenderness: There is no abdominal tenderness. There is no guarding.  Musculoskeletal: Normal range of motion.     Comments: BLE are symmetric in appearance without any overlying warmth, erythema, edema.   Skin:    General: Skin is warm and dry.     Capillary Refill: Capillary refill takes less than 2 seconds.     Comments: Good distal cap refill. Extremities dusky in appearance or cool to touch.  Neurological:     Mental Status: She is alert  and oriented to person, place, and time.  Psychiatric:        Speech: Speech normal.      ED Treatments / Results  Labs (all labs ordered are listed, but only abnormal results are displayed) Labs Reviewed  BASIC METABOLIC PANEL - Abnormal; Notable for the following components:      Result Value   Potassium 3.3 (*)    Calcium 8.7 (*)  All other components within normal limits  CBC WITH DIFFERENTIAL/PLATELET - Abnormal; Notable for the following components:   Hemoglobin 11.0 (*)    HCT 35.8 (*)    MCV 77.3 (*)    MCH 23.8 (*)    RDW 15.9 (*)    All other components within normal limits  D-DIMER, QUANTITATIVE (NOT AT [facility]) - Abnormal; Notable for the following components:   D-Dimer, Quant 0.60 (*)    All other components within normal limits  PREGNANCY, URINE  TROPONIN I    EKG None  Radiology Dg Chest 2 View  Result Date: 03/11/2019 CLINICAL DATA:  Chest pressure.  Shortness of breath. EXAM: CHEST - 2 VIEW COMPARISON:  05/31/2015. FINDINGS: Mediastinum and hilar structures normal. Lungs are clear. No pleural effusion pneumothorax. Heart size normal. No acute bony abnormality. IMPRESSION: No acute cardiopulmonary disease. Electronically Signed   By: Maisie Fus  Register   On: 03/11/2019 16:45   Ct Angio Chest Pe W And/or Wo Contrast  Result Date: 03/11/2019 CLINICAL DATA:  Shortness of breath for 2 days. No prior history of lung problems. Nonsmoker. History of seasonal allergies. Elevated D-dimer. Inhaled paint fumes yesterday. EXAM: CT ANGIOGRAPHY CHEST WITH CONTRAST TECHNIQUE: Multidetector CT imaging of the chest was performed using the standard protocol during bolus administration of intravenous contrast. Multiplanar CT image reconstructions and MIPs were obtained to evaluate the vascular anatomy. CONTRAST:  83mL ISOVUE-370 IOPAMIDOL (ISOVUE-370) INJECTION 76% COMPARISON:  Chest x-ray 03/11/2019 FINDINGS: Cardiovascular: Pulmonary artery is moderately well opacified. There is  no acute pulmonary embolus. The heart size is normal. No pericardial effusion. Mediastinum/Nodes: The visualized portion of the thyroid gland has a normal appearance. Esophagus is normal in appearance. Lungs/Pleura: There are no consolidations or pleural effusions. No focal pulmonary infiltrates. A 3 millimeter nodule is identified in the LEFT UPPER lobe on image 27/7. Upper Abdomen: No acute abnormality. Musculoskeletal: No chest wall abnormality. No acute or significant osseous findings. An 8 millimeter mass is identified in the UPPER-OUTER QUADRANT of the RIGHT breast. A 6 millimeter mass is identified in the LATERAL portion of the LEFT breast. Recommend further evaluation with mammography and ultrasound Review of the MIP images confirms the above findings. IMPRESSION: 1. Technically adequate exam showing no acute pulmonary embolus. 2. Small bilateral breast masses warranting further evaluation. Recommend bilateral diagnostic mammogram and bilateral breast ultrasound. 3. 3 millimeter nodule within the LEFT upper lobe. No follow-up needed if patient is low-risk. Non-contrast chest CT can be considered in 12 months if patient is high-risk. This recommendation follows the consensus statement: Guidelines for Management of Incidental Pulmonary Nodules Detected on CT Images: From the Fleischner Society 2017; Radiology 2017; 284:228-243. Electronically Signed   By: Norva Pavlov M.D.   On: 03/11/2019 18:20    Procedures Procedures (including critical care time)  Medications Ordered in ED Medications  iopamidol (ISOVUE-370) 76 % injection 100 mL (83 mLs Intravenous Contrast Given 03/11/19 1752)  albuterol (PROVENTIL HFA;VENTOLIN HFA) 108 (90 Base) MCG/ACT inhaler 1 puff (1 puff Inhalation Given 03/11/19 1934)     Initial Impression / Assessment and Plan / ED Course  I have reviewed the triage vital signs and the nursing notes.  Pertinent labs & imaging results that were available during my care of the  patient were reviewed by me and considered in my medical decision making (see chart for details).        33 y.o. F with PMH/o anxiety, pre-eclampsia who presents for evaluation of SOB and chest heaviness that has been  going on since yesterday. No fevers, cough. Patient is afebrile, non-toxic appearing, sitting comfortably on examination table. Vital signs reviewed and stable.  Lungs clear to auscultation bilaterally. No evidence of respiratory distress. Consider irritant exposure given recent history of spray painting. Low suspicion for infectious etiology given lack of symptoms. Presentation atypical for ACS etiology but also a consideration. Low suspicion for PE but given lack of infectious symptoms, also a consideration. History/physical exam is not concerning for aortic dissection. Plan for labs, CXR.   Troponin is unremarkable.  BMP shows potassium of 3.3.  CBC shows no leukocytosis.  Hemoglobin is 11.0.  Otherwise unremarkable.  Urine pregnancy negative.  D-dimer is slightly positive at 0.60.  Chest x-ray negative for any acute infectious etiology.  At this time, patient's symptoms have been constant ongoing for the last 24 hours.  Additionally, she has no known risk factors for ACS etiology and her presentation is atypical.  One negative troponin is sufficient for ACS rule out.  Given positive d-dimer, will proceed with CTA of chest for evaluation of PE.  Discussed results with patient.  Updated her on plan and she is agreeable.  CTA shows no evidence of PE. There is mention of bilateral breast masses noted. Recommend further mammogram and U/S evaluation. 3 mm lung nodule also noted.   Discussed results with patient, including left lung nodule, breast masses.  This time, her vitals are stable.  She is not hypoxic or tachycardic.  I suspect this may be irritation from her recent spray pending.  We will give her albuterol inhaler.  Instructed patient to follow-up with breath center of [facility] as  directed. At this time, patient exhibits no emergent life-threatening condition that require further evaluation in ED or admission. Patient had ample opportunity for questions and discussion. All patient's questions were answered with full understanding. Strict return precautions discussed. Patient expresses understanding and agreement to plan.   Portions of this note were generated with Scientist, clinical (histocompatibility and immunogenetics). Dictation errors may occur despite best attempts at proofreading.   Final Clinical Impressions(s) / ED Diagnoses   Final diagnoses:  Shortness of breath  Lung nodule  Breast mass    ED Discharge Orders    None       Rosana Hoes 03/11/19 2311    Little, Ambrose Finland, MD 03/11/19 2329

## 2019-03-11 NOTE — ED Triage Notes (Signed)
Yesterday she felt SOB. Started after spray painting her tub. She was able to sleep. Today at times she feels a heaviness in her chest that makes her feel like she cannot breathe.

## 2019-03-11 NOTE — Discharge Instructions (Signed)
As we discussed, your CT scan today showed no evidence of blood clot in the lung.  There was mention of a small pulmonary nodule in the left lung.  This needs follow-up evaluation with a primary care doctor and repeat imaging about a year.  Additionally as we discussed, there were small masses in each bilateral breast that need further evaluation.  Please follow-up with the breast center of [facility] to arrange further evaluation of these masses.  Use inhaler as directed.  Follow-up with [facility] to establish a primary care doctor if you do not have one.   Return emergency department for any worsening difficulty breathing, fevers or any other worsening concerning symptoms.

## 2021-01-14 ENCOUNTER — Other Ambulatory Visit: Payer: Self-pay

## 2021-01-14 ENCOUNTER — Encounter (HOSPITAL_BASED_OUTPATIENT_CLINIC_OR_DEPARTMENT_OTHER): Payer: Self-pay | Admitting: *Deleted

## 2021-01-14 DIAGNOSIS — U071 COVID-19: Secondary | ICD-10-CM | POA: Insufficient documentation

## 2021-01-14 DIAGNOSIS — M791 Myalgia, unspecified site: Secondary | ICD-10-CM | POA: Insufficient documentation

## 2021-01-14 DIAGNOSIS — R519 Headache, unspecified: Secondary | ICD-10-CM | POA: Insufficient documentation

## 2021-01-14 DIAGNOSIS — R109 Unspecified abdominal pain: Secondary | ICD-10-CM | POA: Insufficient documentation

## 2021-01-14 DIAGNOSIS — M549 Dorsalgia, unspecified: Secondary | ICD-10-CM | POA: Insufficient documentation

## 2021-01-14 LAB — CBC WITH DIFFERENTIAL/PLATELET
Abs Immature Granulocytes: 0.02 10*3/uL (ref 0.00–0.07)
Basophils Absolute: 0 10*3/uL (ref 0.0–0.1)
Basophils Relative: 0 %
Eosinophils Absolute: 0 10*3/uL (ref 0.0–0.5)
Eosinophils Relative: 0 %
HCT: 30.8 % — ABNORMAL LOW (ref 36.0–46.0)
Hemoglobin: 10 g/dL — ABNORMAL LOW (ref 12.0–15.0)
Immature Granulocytes: 1 %
Lymphocytes Relative: 18 %
Lymphs Abs: 0.6 10*3/uL — ABNORMAL LOW (ref 0.7–4.0)
MCH: 24.2 pg — ABNORMAL LOW (ref 26.0–34.0)
MCHC: 32.5 g/dL (ref 30.0–36.0)
MCV: 74.6 fL — ABNORMAL LOW (ref 80.0–100.0)
Monocytes Absolute: 0.4 10*3/uL (ref 0.1–1.0)
Monocytes Relative: 13 %
Neutro Abs: 2.3 10*3/uL (ref 1.7–7.7)
Neutrophils Relative %: 68 %
Platelets: 252 10*3/uL (ref 150–400)
RBC: 4.13 MIL/uL (ref 3.87–5.11)
RDW: 14.5 % (ref 11.5–15.5)
WBC: 3.4 10*3/uL — ABNORMAL LOW (ref 4.0–10.5)
nRBC: 0 % (ref 0.0–0.2)

## 2021-01-14 LAB — URINALYSIS, MICROSCOPIC (REFLEX)

## 2021-01-14 LAB — URINALYSIS, ROUTINE W REFLEX MICROSCOPIC
Bilirubin Urine: NEGATIVE
Glucose, UA: NEGATIVE mg/dL
Ketones, ur: 80 mg/dL — AB
Leukocytes,Ua: NEGATIVE
Nitrite: NEGATIVE
Protein, ur: NEGATIVE mg/dL
Specific Gravity, Urine: 1.01 (ref 1.005–1.030)
pH: 6 (ref 5.0–8.0)

## 2021-01-14 LAB — COMPREHENSIVE METABOLIC PANEL
ALT: 18 U/L (ref 0–44)
AST: 21 U/L (ref 15–41)
Albumin: 4.3 g/dL (ref 3.5–5.0)
Alkaline Phosphatase: 79 U/L (ref 38–126)
Anion gap: 12 (ref 5–15)
BUN: 7 mg/dL (ref 6–20)
CO2: 20 mmol/L — ABNORMAL LOW (ref 22–32)
Calcium: 8.8 mg/dL — ABNORMAL LOW (ref 8.9–10.3)
Chloride: 101 mmol/L (ref 98–111)
Creatinine, Ser: 0.82 mg/dL (ref 0.44–1.00)
GFR, Estimated: >60 mL/min (ref >60)
Glucose, Bld: 97 mg/dL (ref 70–99)
Potassium: 3.1 mmol/L — ABNORMAL LOW (ref 3.5–5.1)
Sodium: 133 mmol/L — ABNORMAL LOW (ref 135–145)
Total Bilirubin: 0.3 mg/dL (ref 0.3–1.2)
Total Protein: 7.9 g/dL (ref 6.5–8.1)

## 2021-01-14 LAB — LACTIC ACID, PLASMA: Lactic Acid, Venous: 1 mmol/L (ref 0.5–1.9)

## 2021-01-14 LAB — HCG, QUANTITATIVE, PREGNANCY: hCG, Beta Chain, Quant, S: 112 m[IU]/mL — ABNORMAL HIGH (ref <5)

## 2021-01-14 MED ORDER — IBUPROFEN 400 MG PO TABS
400.0000 mg | ORAL_TABLET | Freq: Once | ORAL | Status: AC
  Administered 2021-01-14: 400 mg via ORAL
  Filled 2021-01-14: qty 1

## 2021-01-14 NOTE — ED Triage Notes (Addendum)
Generalized body aches and headache since last night. Mouth pain. States she feels it is coming from recent fasting. She also admits to taking an abortion pill last month. She passed tissue 2 days ago.

## 2021-01-15 ENCOUNTER — Emergency Department (HOSPITAL_BASED_OUTPATIENT_CLINIC_OR_DEPARTMENT_OTHER)
Admission: EM | Admit: 2021-01-15 | Discharge: 2021-01-15 | Disposition: A | Discharge status: Home / Self Care | Payer: Medicaid Other | Attending: Emergency Medicine | Admitting: Emergency Medicine

## 2021-01-15 ENCOUNTER — Emergency Department (HOSPITAL_BASED_OUTPATIENT_CLINIC_OR_DEPARTMENT_OTHER): Admit: 2021-01-15 | Payer: Medicaid Other

## 2021-01-15 ENCOUNTER — Telehealth (HOSPITAL_BASED_OUTPATIENT_CLINIC_OR_DEPARTMENT_OTHER): Payer: Self-pay | Admitting: Emergency Medicine

## 2021-01-15 DIAGNOSIS — N939 Abnormal uterine and vaginal bleeding, unspecified: Secondary | ICD-10-CM

## 2021-01-15 DIAGNOSIS — J069 Acute upper respiratory infection, unspecified: Secondary | ICD-10-CM

## 2021-01-15 LAB — SARS CORONAVIRUS 2 BY RT PCR (HOSPITAL ORDER, PERFORMED IN ~~LOC~~ HOSPITAL LAB): SARS Coronavirus 2: POSITIVE — AB

## 2021-01-15 NOTE — ED Notes (Signed)
Pt made aware of 1600 Appointment for [place] on 01/15/2021.

## 2021-01-15 NOTE — Discharge Instructions (Addendum)
Drink plenty of fluids and get plenty of rest.  Return tomorrow at the given time for an ultrasound of your pelvis to rule out retained products of conception.  Isolate at home until the results of your COVID test are known.

## 2021-01-15 NOTE — ED Provider Notes (Signed)
[facility] EMERGENCY DEPARTMENT Provider Note   CSN: [id number] Arrival date & time: 01/14/21  1953     History Chief Complaint  Patient presents with  . Generalized Body Aches    Deborah Rodriguez is a 35 y.o. female.  Patient is a 35 year old female with history of preeclampsia with prior pregnancy.  Patient also underwent a medication induced abortion 1 month ago.  She presents today with complaints of body aches, headache, and muscle aches that started yesterday.  She denies chest pain, shortness of breath, or cough.  She describes some cramping in her back and abdomen.  She also tells me that she passed some tissue 2 days ago and has been bleeding for the past month since undergoing the abortion.  She denies urinary complaints.  The history is provided by the patient.       Past Medical History:  Diagnosis Date  . Anxiety   . Preeclampsia     There are no problems to display for this patient.   Past Surgical History:  Procedure Laterality Date  . CESAREAN SECTION     x 2  . TONSILLECTOMY       OB History    Gravida  9   Para  5   Term  4   Preterm  1   AB  3   Living  5     SAB  1   IAB  2   Ectopic  0   Multiple  0   Live Births  5        Obstetric Comments  c-sect x 2        Family History  Problem Relation Age of Onset  . Diabetes Maternal Grandfather   . Stroke Paternal Grandfather     Social History   Tobacco Use  . Smoking status: Never Smoker  . Smokeless tobacco: Never Used  Vaping Use  . Vaping Use: Never used  Substance Use Topics  . Alcohol use: Yes    Comment: occ  . Drug use: No    Home Medications Prior to Admission medications   Medication Sig Start Date End Date Taking? Authorizing Provider  cephALEXin (KEFLEX) 500 MG capsule Take 1 capsule (500 mg total) by mouth 2 (two) times daily. 10/29/18   Molpus, John, MD  cholecalciferol (VITAMIN D) 1000 units tablet Take 1,000 Units by mouth daily.     [provider]  etonogestrel (NEXPLANON) 68 MG IMPL implant 1 each by Subdermal route once.    [provider]  fluticasone (FLONASE) 50 MCG/ACT nasal spray Place 1 spray into both nostrils daily as needed for allergies or rhinitis.    [provider]  metoCLOPramide (REGLAN) 10 MG tablet Take 1 tablet (10 mg total) by mouth every 8 (eight) hours as needed for nausea or vomiting. 10/08/18   Shaune Pollack, MD  Multiple Vitamin (MULTIVITAMIN WITH MINERALS) TABS tablet Take 1 tablet by mouth daily.    [provider]  norethindrone-ethinyl estradiol (JUNEL FE,GILDESS FE,LOESTRIN FE) 1-20 MG-MCG tablet Take 1 tablet by mouth at bedtime.    [provider]  norgestimate-ethinyl estradiol (ORTHO-CYCLEN,SPRINTEC,PREVIFEM) 0.25-35 MG-MCG tablet Take 1 tablet by mouth daily.    [provider]  UNABLE TO FIND Take by mouth 1 day or 1 dose. Iron supplement    [provider]    Allergies    Patient has no known allergies.  Review of Systems   Review of Systems  All other systems reviewed and  are negative.   Physical Exam Updated Vital Signs BP 118/88 (BP Location: Right Arm)   Pulse 88   Temp 98.5 F (36.9 C) (Oral)   Resp 16   Ht 5\' 6"  (1.676 m)   Wt 110.5 kg   SpO2 100%   BMI 39.30 kg/m   Physical Exam Vitals and nursing note reviewed.  Constitutional:      General: She is not in acute distress.    Appearance: She is well-developed and well-nourished. She is not diaphoretic.  HENT:     Head: Normocephalic and atraumatic.  Cardiovascular:     Rate and Rhythm: Normal rate and regular rhythm.     Heart sounds: No murmur heard. No friction rub. No gallop.   Pulmonary:     Effort: Pulmonary effort is normal. No respiratory distress.     Breath sounds: Normal breath sounds. No wheezing.  Abdominal:     General: Bowel sounds are normal. There is no distension.     Palpations: Abdomen is soft.     Tenderness: There is  no abdominal tenderness.  Musculoskeletal:        General: Normal range of motion.     Cervical back: Normal range of motion and neck supple.  Skin:    General: Skin is warm and dry.  Neurological:     Mental Status: She is alert and oriented to person, place, and time.     ED Results / Procedures / Treatments   Labs (all labs ordered are listed, but only abnormal results are displayed) Labs Reviewed  CBC WITH DIFFERENTIAL/PLATELET - Abnormal; Notable for the following components:      Result Value   WBC 3.4 (*)    Hemoglobin 10.0 (*)    HCT 30.8 (*)    MCV 74.6 (*)    MCH 24.2 (*)    Lymphs Abs 0.6 (*)    All other components within normal limits  URINALYSIS, ROUTINE W REFLEX MICROSCOPIC - Abnormal; Notable for the following components:   Hgb urine dipstick MODERATE (*)    Ketones, ur 80 (*)    All other components within normal limits  COMPREHENSIVE METABOLIC PANEL - Abnormal; Notable for the following components:   Sodium 133 (*)    Potassium 3.1 (*)    CO2 20 (*)    Calcium 8.8 (*)    All other components within normal limits  HCG, QUANTITATIVE, PREGNANCY - Abnormal; Notable for the following components:   hCG, Beta Chain, Quant, S 112 (*)    All other components within normal limits  URINALYSIS, MICROSCOPIC (REFLEX) - Abnormal; Notable for the following components:   Bacteria, UA FEW (*)    All other components within normal limits  CULTURE, BLOOD (SINGLE)  SARS CORONAVIRUS 2 BY RT PCR (HOSPITAL ORDER, PERFORMED IN  HOSPITAL LAB)  LACTIC ACID, PLASMA    EKG None  Radiology No results found.  Procedures Procedures   Medications Ordered in ED Medications  ibuprofen (ADVIL) tablet 400 mg (400 mg Oral Given 01/14/21 2006)    ED Course  I have reviewed the triage vital signs and the nursing notes.  Pertinent labs & imaging results that were available during my care of the patient were reviewed by me and considered in my medical decision making  (see chart for details).    MDM Rules/Calculators/A&P  Patient presenting here with complaints of body aches, headache, fever, and feeling generally unwell for the past 2 days.  Patient had a medication induced abortion  1 month ago, but denies significant abdominal discomfort.  Her laboratory studies are unremarkable, but beta-hCG remains mildly elevated at 100.  I suspect this is on the decline rather than representative of a new pregnancy.  I have decided to obtain a Covid test as well as ultrasound to evaluate for retained products of conception.  Unfortunately ultrasound is not available this time of day and patient will return in the morning for this study.  Patient does not want to stay in the ER and prefers to go home and return for the study.  After she was discharged, her Covid test returned positive.  I highly suspect that this is the cause of her fever and the way she is feeling rather than retained products, but she should undergo the study anyway.  Patient called notified of the Covid results and advised of the importance to return for her ultrasound.  Final Clinical Impression(s) / ED Diagnoses Final diagnoses:  None    Rx / DC Orders ED Discharge Orders    None       Geoffery Lyons, MD 01/15/21 [phone]

## 2021-01-15 NOTE — ED Notes (Signed)
Patient verbalizes understanding of discharge instructions. Opportunity for questioning and answers were provided. Armband removed by staff, pt discharged from ED ambulatory to home.  

## 2021-01-16 ENCOUNTER — Telehealth: Payer: Self-pay | Admitting: Nurse Practitioner

## 2021-01-16 NOTE — Telephone Encounter (Signed)
Called to Discuss with patient about Covid symptoms and the use of the monoclonal antibody infusion for those with mild to moderate Covid symptoms and at a high risk of hospitalization.     Pt appears to qualify for this infusion due to co-morbid conditions and/or a member of an at-risk group in accordance with the FDA Emergency Use Authorization.    Unable to reach pt. Voicemail left.   Symptom onset: 01/14/21 Vaccinated: No  Qualified for Infusion: Yes, bmi 39, recent abortion, African American   Liz Claiborne, NP [facility]  [phone]

## 2021-01-20 LAB — CULTURE, BLOOD (SINGLE)
Culture: NO GROWTH
Special Requests: ADEQUATE

## 2021-02-18 IMAGING — CT CT ANGIOGRAPHY CHEST
1 of 8 series · 3 of 16 positions shown · IV contrast (iopamidol)
Comparison: Chest x-ray 03/11/2019

CLINICAL DATA: Shortness of breath for 2 days. No prior history of
lung problems. Nonsmoker. History of seasonal allergies. Elevated
D-dimer. Inhaled paint fumes yesterday.

EXAM:
CT ANGIOGRAPHY CHEST WITH CONTRAST
TECHNIQUE: Multidetector CT imaging of the chest was performed using the
standard protocol during bolus administration of intravenous
contrast. Multiplanar CT image reconstructions and MIPs were
obtained to evaluate the vascular anatomy.
CONTRAST:  83mL [id number] IOPAMIDOL ([id number]) INJECTION 76%

[Series 6: pe thins · axial · 0.75mm/px · z∈[+512,+774]mm · 3 of 263 slices shown]
[im 1/263  lung]
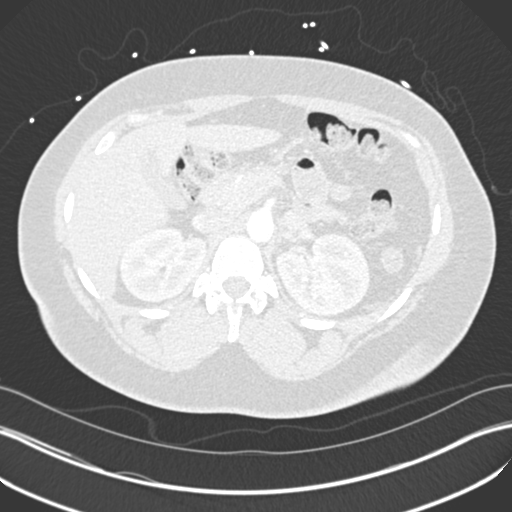
[im 132/263  soft-tissue]
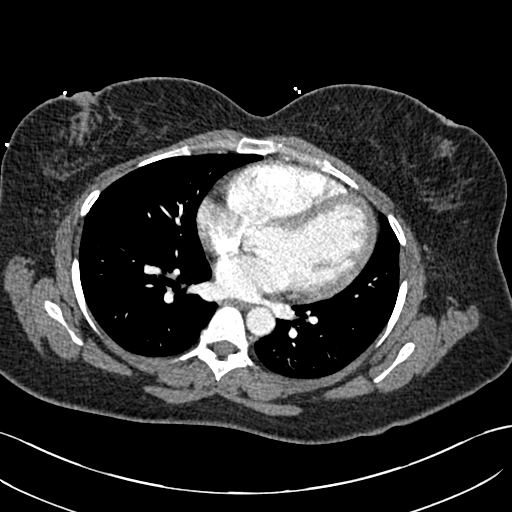
[im 263/263  lung]
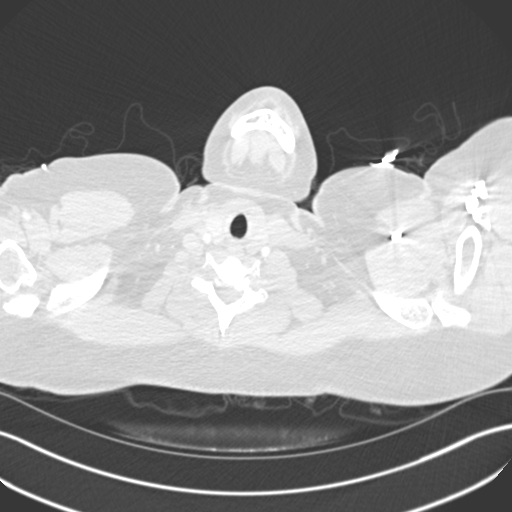

[3 of 16 positions shown; findings below may reference images not displayed]

FINDINGS: Cardiovascular: Pulmonary artery is moderately well opacified. There
is no acute pulmonary embolus. The heart size is normal. No
pericardial effusion.

Mediastinum/Nodes: The visualized portion of the thyroid gland has a
normal appearance. Esophagus is normal in appearance.

Lungs/Pleura: There are no consolidations or pleural effusions. No
focal pulmonary infiltrates. A 3 millimeter nodule is identified in
the LEFT UPPER lobe on image [DATE].

Upper Abdomen: No acute abnormality.

Musculoskeletal: No chest wall abnormality. No acute or significant
osseous findings.

An 8 millimeter mass is identified in the UPPER-OUTER QUADRANT of
the RIGHT breast. A 6 millimeter mass is identified in the LATERAL
portion of the LEFT breast. Recommend further evaluation with
mammography and ultrasound

Review of the MIP images confirms the above findings.
IMPRESSION: 1. Technically adequate exam showing no acute pulmonary embolus.
2. Small bilateral breast masses warranting further evaluation.
Recommend bilateral diagnostic mammogram and bilateral breast
ultrasound.
3. 3 millimeter nodule within the LEFT upper lobe. No follow-up
needed if patient is low-risk. Non-contrast chest CT can be
considered in 12 months if patient is high-risk. This recommendation
follows the consensus statement: Guidelines for Management of
Incidental Pulmonary Nodules Detected on CT Images: From the

## 2021-12-24 DIAGNOSIS — Z131 Encounter for screening for diabetes mellitus: Secondary | ICD-10-CM | POA: Diagnosis not present

## 2021-12-24 DIAGNOSIS — Z136 Encounter for screening for cardiovascular disorders: Secondary | ICD-10-CM | POA: Diagnosis not present

## 2021-12-24 DIAGNOSIS — Z1329 Encounter for screening for other suspected endocrine disorder: Secondary | ICD-10-CM | POA: Diagnosis not present

## 2021-12-24 DIAGNOSIS — Z6841 Body Mass Index (BMI) 40.0 and over, adult: Secondary | ICD-10-CM | POA: Diagnosis not present

## 2021-12-24 DIAGNOSIS — Z0001 Encounter for general adult medical examination with abnormal findings: Secondary | ICD-10-CM | POA: Diagnosis not present

## 2021-12-24 DIAGNOSIS — R635 Abnormal weight gain: Secondary | ICD-10-CM | POA: Diagnosis not present

## 2022-01-07 DIAGNOSIS — E782 Mixed hyperlipidemia: Secondary | ICD-10-CM | POA: Diagnosis not present

## 2022-01-07 DIAGNOSIS — R7303 Prediabetes: Secondary | ICD-10-CM | POA: Diagnosis not present

## 2022-01-07 DIAGNOSIS — D509 Iron deficiency anemia, unspecified: Secondary | ICD-10-CM | POA: Diagnosis not present

## 2022-01-07 DIAGNOSIS — H6690 Otitis media, unspecified, unspecified ear: Secondary | ICD-10-CM | POA: Diagnosis not present

## 2022-03-05 DIAGNOSIS — Z30011 Encounter for initial prescription of contraceptive pills: Secondary | ICD-10-CM | POA: Diagnosis not present

## 2022-03-05 DIAGNOSIS — N760 Acute vaginitis: Secondary | ICD-10-CM | POA: Diagnosis not present

## 2022-04-09 DIAGNOSIS — R635 Abnormal weight gain: Secondary | ICD-10-CM | POA: Diagnosis not present

## 2022-04-09 DIAGNOSIS — H60541 Acute eczematoid otitis externa, right ear: Secondary | ICD-10-CM | POA: Diagnosis not present

## 2022-04-09 DIAGNOSIS — R7303 Prediabetes: Secondary | ICD-10-CM | POA: Diagnosis not present

## 2022-04-09 DIAGNOSIS — Z6841 Body Mass Index (BMI) 40.0 and over, adult: Secondary | ICD-10-CM | POA: Diagnosis not present

## 2022-04-09 DIAGNOSIS — E782 Mixed hyperlipidemia: Secondary | ICD-10-CM | POA: Diagnosis not present

## 2022-04-21 DIAGNOSIS — Z01411 Encounter for gynecological examination (general) (routine) with abnormal findings: Secondary | ICD-10-CM | POA: Diagnosis not present

## 2022-04-21 DIAGNOSIS — Z124 Encounter for screening for malignant neoplasm of cervix: Secondary | ICD-10-CM | POA: Diagnosis not present

## 2022-04-21 DIAGNOSIS — R7303 Prediabetes: Secondary | ICD-10-CM | POA: Diagnosis not present

## 2022-04-21 DIAGNOSIS — B3732 Chronic candidiasis of vulva and vagina: Secondary | ICD-10-CM | POA: Diagnosis not present

## 2022-04-21 DIAGNOSIS — Z1151 Encounter for screening for human papillomavirus (HPV): Secondary | ICD-10-CM | POA: Diagnosis not present

## 2022-04-21 DIAGNOSIS — E785 Hyperlipidemia, unspecified: Secondary | ICD-10-CM | POA: Diagnosis not present

## 2022-04-21 DIAGNOSIS — Z6841 Body Mass Index (BMI) 40.0 and over, adult: Secondary | ICD-10-CM | POA: Diagnosis not present

## 2022-04-21 DIAGNOSIS — N761 Subacute and chronic vaginitis: Secondary | ICD-10-CM | POA: Diagnosis not present

## 2022-04-21 DIAGNOSIS — B9689 Other specified bacterial agents as the cause of diseases classified elsewhere: Secondary | ICD-10-CM | POA: Diagnosis not present

## 2022-08-21 ENCOUNTER — Other Ambulatory Visit: Payer: Self-pay

## 2022-08-21 ENCOUNTER — Emergency Department (HOSPITAL_BASED_OUTPATIENT_CLINIC_OR_DEPARTMENT_OTHER)
Admission: EM | Admit: 2022-08-21 | Discharge: 2022-08-21 | Discharge status: Against Medical Advice | Payer: Medicaid Other

## 2023-01-01 DIAGNOSIS — Z131 Encounter for screening for diabetes mellitus: Secondary | ICD-10-CM | POA: Diagnosis not present

## 2023-01-01 DIAGNOSIS — D509 Iron deficiency anemia, unspecified: Secondary | ICD-10-CM | POA: Diagnosis not present

## 2023-01-01 DIAGNOSIS — Z6841 Body Mass Index (BMI) 40.0 and over, adult: Secondary | ICD-10-CM | POA: Diagnosis not present

## 2023-01-01 DIAGNOSIS — Z136 Encounter for screening for cardiovascular disorders: Secondary | ICD-10-CM | POA: Diagnosis not present

## 2023-01-01 DIAGNOSIS — E782 Mixed hyperlipidemia: Secondary | ICD-10-CM | POA: Diagnosis not present

## 2023-01-01 DIAGNOSIS — R7303 Prediabetes: Secondary | ICD-10-CM | POA: Diagnosis not present

## 2023-01-01 DIAGNOSIS — Z0001 Encounter for general adult medical examination with abnormal findings: Secondary | ICD-10-CM | POA: Diagnosis not present

## 2023-02-04 DIAGNOSIS — R7303 Prediabetes: Secondary | ICD-10-CM | POA: Diagnosis not present

## 2023-02-04 DIAGNOSIS — R221 Localized swelling, mass and lump, neck: Secondary | ICD-10-CM | POA: Diagnosis not present

## 2023-02-04 DIAGNOSIS — E782 Mixed hyperlipidemia: Secondary | ICD-10-CM | POA: Diagnosis not present

## 2023-02-10 DIAGNOSIS — R221 Localized swelling, mass and lump, neck: Secondary | ICD-10-CM | POA: Diagnosis not present

## 2023-02-10 DIAGNOSIS — E049 Nontoxic goiter, unspecified: Secondary | ICD-10-CM | POA: Diagnosis not present

## 2023-02-18 DIAGNOSIS — R7303 Prediabetes: Secondary | ICD-10-CM | POA: Diagnosis not present

## 2023-02-18 DIAGNOSIS — E069 Thyroiditis, unspecified: Secondary | ICD-10-CM | POA: Diagnosis not present

## 2023-02-18 DIAGNOSIS — E010 Iodine-deficiency related diffuse (endemic) goiter: Secondary | ICD-10-CM | POA: Diagnosis not present

## 2023-03-23 DIAGNOSIS — S52572A Other intraarticular fracture of lower end of left radius, initial encounter for closed fracture: Secondary | ICD-10-CM | POA: Diagnosis not present

## 2023-03-23 DIAGNOSIS — M25532 Pain in left wrist: Secondary | ICD-10-CM | POA: Diagnosis not present

## 2023-03-25 DIAGNOSIS — S62102D Fracture of unspecified carpal bone, left wrist, subsequent encounter for fracture with routine healing: Secondary | ICD-10-CM | POA: Diagnosis not present

## 2023-03-25 DIAGNOSIS — E010 Iodine-deficiency related diffuse (endemic) goiter: Secondary | ICD-10-CM | POA: Diagnosis not present

## 2023-03-25 DIAGNOSIS — E069 Thyroiditis, unspecified: Secondary | ICD-10-CM | POA: Diagnosis not present

## 2023-03-30 DIAGNOSIS — S52572A Other intraarticular fracture of lower end of left radius, initial encounter for closed fracture: Secondary | ICD-10-CM | POA: Diagnosis not present

## 2023-03-30 DIAGNOSIS — M25532 Pain in left wrist: Secondary | ICD-10-CM | POA: Diagnosis not present

## 2023-03-30 DIAGNOSIS — S52572D Other intraarticular fracture of lower end of left radius, subsequent encounter for closed fracture with routine healing: Secondary | ICD-10-CM | POA: Diagnosis not present

## 2023-04-06 DIAGNOSIS — S52572D Other intraarticular fracture of lower end of left radius, subsequent encounter for closed fracture with routine healing: Secondary | ICD-10-CM | POA: Diagnosis not present

## 2023-04-15 DIAGNOSIS — S52572A Other intraarticular fracture of lower end of left radius, initial encounter for closed fracture: Secondary | ICD-10-CM | POA: Diagnosis not present

## 2023-04-15 DIAGNOSIS — S52572D Other intraarticular fracture of lower end of left radius, subsequent encounter for closed fracture with routine healing: Secondary | ICD-10-CM | POA: Diagnosis not present

## 2023-04-20 DIAGNOSIS — E049 Nontoxic goiter, unspecified: Secondary | ICD-10-CM | POA: Diagnosis not present

## 2023-04-20 DIAGNOSIS — R7989 Other specified abnormal findings of blood chemistry: Secondary | ICD-10-CM | POA: Diagnosis not present

## 2023-04-20 DIAGNOSIS — R635 Abnormal weight gain: Secondary | ICD-10-CM | POA: Diagnosis not present

## 2023-05-04 DIAGNOSIS — M25532 Pain in left wrist: Secondary | ICD-10-CM | POA: Diagnosis not present

## 2023-05-04 DIAGNOSIS — S52572D Other intraarticular fracture of lower end of left radius, subsequent encounter for closed fracture with routine healing: Secondary | ICD-10-CM | POA: Diagnosis not present

## 2023-11-09 DIAGNOSIS — R7303 Prediabetes: Secondary | ICD-10-CM | POA: Diagnosis not present

## 2023-11-09 DIAGNOSIS — E782 Mixed hyperlipidemia: Secondary | ICD-10-CM | POA: Diagnosis not present

## 2023-11-09 DIAGNOSIS — E010 Iodine-deficiency related diffuse (endemic) goiter: Secondary | ICD-10-CM | POA: Diagnosis not present

## 2023-11-09 DIAGNOSIS — D508 Other iron deficiency anemias: Secondary | ICD-10-CM | POA: Diagnosis not present

## 2023-12-05 DIAGNOSIS — R1084 Generalized abdominal pain: Secondary | ICD-10-CM | POA: Diagnosis not present

## 2023-12-05 DIAGNOSIS — R0602 Shortness of breath: Secondary | ICD-10-CM | POA: Diagnosis not present

## 2023-12-05 DIAGNOSIS — R109 Unspecified abdominal pain: Secondary | ICD-10-CM | POA: Diagnosis not present

## 2023-12-05 DIAGNOSIS — J9811 Atelectasis: Secondary | ICD-10-CM | POA: Diagnosis not present

## 2023-12-05 DIAGNOSIS — R1012 Left upper quadrant pain: Secondary | ICD-10-CM | POA: Diagnosis not present

## 2023-12-05 DIAGNOSIS — R0789 Other chest pain: Secondary | ICD-10-CM | POA: Diagnosis not present

## 2023-12-05 DIAGNOSIS — R0989 Other specified symptoms and signs involving the circulatory and respiratory systems: Secondary | ICD-10-CM | POA: Diagnosis not present

## 2023-12-05 DIAGNOSIS — R1013 Epigastric pain: Secondary | ICD-10-CM | POA: Diagnosis not present

## 2023-12-05 DIAGNOSIS — R1011 Right upper quadrant pain: Secondary | ICD-10-CM | POA: Diagnosis not present

## 2023-12-06 DIAGNOSIS — R9431 Abnormal electrocardiogram [ECG] [EKG]: Secondary | ICD-10-CM | POA: Diagnosis not present

## 2023-12-31 DIAGNOSIS — E049 Nontoxic goiter, unspecified: Secondary | ICD-10-CM | POA: Diagnosis not present

## 2024-03-18 DIAGNOSIS — Z01419 Encounter for gynecological examination (general) (routine) without abnormal findings: Secondary | ICD-10-CM | POA: Diagnosis not present

## 2024-04-20 DIAGNOSIS — R0989 Other specified symptoms and signs involving the circulatory and respiratory systems: Secondary | ICD-10-CM | POA: Diagnosis not present

## 2024-04-20 DIAGNOSIS — R051 Acute cough: Secondary | ICD-10-CM | POA: Diagnosis not present

## 2024-04-20 DIAGNOSIS — Z753 Unavailability and inaccessibility of health-care facilities: Secondary | ICD-10-CM | POA: Diagnosis not present

## 2024-04-20 DIAGNOSIS — R059 Cough, unspecified: Secondary | ICD-10-CM | POA: Diagnosis not present
